# Patient Record
Sex: Female | Born: 1990 | Race: White | Hispanic: No | Marital: Single | Smoking: Never smoker
Health system: Southern US, Community
[De-identification: ages and names within clinical notes are randomized; demographics above are authoritative.]

## PROBLEM LIST (undated history)

## (undated) DIAGNOSIS — F909 Attention-deficit hyperactivity disorder, unspecified type: Secondary | ICD-10-CM

## (undated) DIAGNOSIS — J353 Hypertrophy of tonsils with hypertrophy of adenoids: Secondary | ICD-10-CM

---
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
## 2011-01-03 ENCOUNTER — Telehealth: Payer: Self-pay

## 2011-01-03 NOTE — Telephone Encounter (Signed)
Phone call from mother today, requesting Rx for Concerta 36 mg daily.

## 2011-01-04 ENCOUNTER — Other Ambulatory Visit: Payer: Self-pay

## 2011-01-04 MED ORDER — METHYLPHENIDATE HCL ER (OSM) 36 MG PO TBCR: 36.0000 mg | EXTENDED_RELEASE_TABLET | ORAL | Status: DC

## 2011-01-04 NOTE — Telephone Encounter (Signed)
Written Rx Concerta 36mg  daily.  Need to see last time pt was here- like to see q 6 months for this. May be time for PE.

## 2011-01-18 ENCOUNTER — Encounter: Payer: Self-pay | Admitting: Internal Medicine

## 2011-01-18 ENCOUNTER — Ambulatory Visit (INDEPENDENT_AMBULATORY_CARE_PROVIDER_SITE_OTHER): Payer: BC Managed Care – PPO | Admitting: Internal Medicine

## 2011-01-18 VITALS — BP 108/80 | HR 84 | Temp 98.7°F | Ht 62.0 in | Wt 162.0 lb

## 2011-01-18 DIAGNOSIS — J329 Chronic sinusitis, unspecified: Secondary | ICD-10-CM

## 2011-01-18 DIAGNOSIS — J029 Acute pharyngitis, unspecified: Secondary | ICD-10-CM

## 2011-01-18 DIAGNOSIS — F988 Other specified behavioral and emotional disorders with onset usually occurring in childhood and adolescence: Secondary | ICD-10-CM

## 2011-01-18 NOTE — Progress Notes (Signed)
  Subjective:    Patient ID: Danielle Medina, female    DOB: [birth year], 21 y.o.   MRN: [id number]  HPI 21 year old white female in today with respiratory infection symptoms. Has had cough and congestion. Parents have been ill with respiratory infection symptoms. Getting ready to start back to [employer] next week. No shaking chills or myalgias. Tolerating Concerta well for attention deficit disorder symptoms. Recently picked up a new prescription. Now on generic Concerta.    Review of Systems     Objective:   Physical Exam HEENT exam: Pharynx slightly injected; rapid strep screen negative. TMs are clear; neck supple without significant adenopathy; chest clear. Sounds nasally congested.        Assessment & Plan:  Sinusitis  Attention deficit disorder  Plan: Zithromax Z-Pak 2 tablets by mouth day one followed by 1 tablet by mouth days 2 through 5. May prescribe generic Concerta from now on since it's now available

## 2011-01-18 NOTE — Patient Instructions (Addendum)
Take generic Concerta as corrected for attention deficit disorder. Zithromax Z-PAK take 2 tablets by mouth day one followed by 1 tablet by mouth days 2 through 5.

## 2011-02-11 ENCOUNTER — Telehealth: Payer: Self-pay | Admitting: Internal Medicine

## 2011-02-11 MED ORDER — METHYLPHENIDATE HCL ER (OSM) 36 MG PO TBCR: 36.0000 mg | EXTENDED_RELEASE_TABLET | Freq: Two times a day (BID) | ORAL | Status: DC

## 2011-02-11 NOTE — Telephone Encounter (Signed)
Rx written for Concerta 36mg  1 po bid #60/0 rf

## 2011-03-04 ENCOUNTER — Encounter: Payer: Self-pay | Admitting: Internal Medicine

## 2011-03-27 ENCOUNTER — Telehealth: Payer: Self-pay | Admitting: Internal Medicine

## 2011-03-27 MED ORDER — METHYLPHENIDATE HCL ER (OSM) 36 MG PO TBCR: 36.0000 mg | EXTENDED_RELEASE_TABLET | Freq: Two times a day (BID) | ORAL | Status: DC

## 2011-03-27 NOTE — Telephone Encounter (Signed)
rx for Concerta 36 mg bid printed today.

## 2011-04-15 ENCOUNTER — Ambulatory Visit (INDEPENDENT_AMBULATORY_CARE_PROVIDER_SITE_OTHER): Payer: BC Managed Care – PPO | Admitting: Internal Medicine

## 2011-04-15 ENCOUNTER — Encounter: Payer: Self-pay | Admitting: Internal Medicine

## 2011-04-15 VITALS — BP 106/70 | HR 76 | Temp 98.4°F | Ht 63.0 in | Wt 157.0 lb

## 2011-04-15 DIAGNOSIS — F988 Other specified behavioral and emotional disorders with onset usually occurring in childhood and adolescence: Secondary | ICD-10-CM

## 2011-04-15 DIAGNOSIS — Z0000 Encounter for general adult medical examination without abnormal findings: Secondary | ICD-10-CM

## 2011-04-15 DIAGNOSIS — E785 Hyperlipidemia, unspecified: Secondary | ICD-10-CM

## 2011-04-15 DIAGNOSIS — J069 Acute upper respiratory infection, unspecified: Secondary | ICD-10-CM

## 2011-04-15 LAB — LIPID PANEL
Cholesterol: 211 mg/dL — ABNORMAL HIGH (ref 0–200)
HDL: 77 mg/dL (ref >39)
LDL Cholesterol: 101 mg/dL — ABNORMAL HIGH (ref 0–99)
Total CHOL/HDL Ratio: 2.7 Ratio
Triglycerides: 164 mg/dL — ABNORMAL HIGH (ref <150)
VLDL: 33 mg/dL (ref 0–40)

## 2011-04-16 LAB — CBC WITH DIFFERENTIAL/PLATELET
Basophils Absolute: 0 10*3/uL (ref 0.0–0.1)
Basophils Relative: 0 % (ref 0–1)
Eosinophils Absolute: 0.1 10*3/uL (ref 0.0–0.7)
MCH: 28.3 pg (ref 26.0–34.0)
MCHC: 31.9 g/dL (ref 30.0–36.0)
Neutro Abs: 3.7 10*3/uL (ref 1.7–7.7)
Neutrophils Relative %: 58 % (ref 43–77)
Platelets: 290 10*3/uL (ref 150–400)
RBC: 4.56 MIL/uL (ref 3.87–5.11)

## 2011-04-19 DIAGNOSIS — E785 Hyperlipidemia, unspecified: Secondary | ICD-10-CM | POA: Insufficient documentation

## 2011-04-19 NOTE — Patient Instructions (Signed)
Take Zithromax Z-PAK as directed for respiratory infection. Continue with Concerta 36 mg daily. Return in 6 months for followup on attention deficit.

## 2011-04-19 NOTE — Progress Notes (Signed)
  Subjective:    Patient ID: Danielle Medina, female    DOB: 1990/12/26, 21 y.o.   MRN: [id number]  HPI 21 year old white female in today for health maintenance exam. She has an acute respiratory infection with nasal congestion, cough and sore throat. She has a history of attention deficit disorder and is on Concerta. She has GYN physician is on oral contraceptives. No other chronic problems.  Family history: Mother with history of hyperlipidemia. Father with hyperlipidemia and hypertension. One brother with GE reflux and attention deficit.  Social history: She is single never married. She is attending [employer]. Thinks she might like to studying nursing. Social alcohol consumption. Nonsmoker.    Review of Systems negative except as above     Objective:   Physical Exam HEENT exam: Pharynx slightly injected without exudate. TMs are clear; sounds nasally congested, neck is supple without adenopathy, chest clear to auscultation. Breasts normal female. Cardiac exam regular rate and rhythm normal S1 and S2. Abdomen no hepatosplenomegaly masses or tenderness. GYN exam is deferred to GYN physician. Extremities without deformity. Neuro no focal deficits.        Assessment & Plan:  URI  Attention deficit disorder  Hyperlipidemia discovered today with nonfasting lab work.  Plan: In 6 months patient should have fasting lipid panel. Needs to try to follow a low-fat diet. Has family history of hyperlipidemia in both mother and father.  Patient to return in 6 months for followup on attention deficit and also to have fasting lipid panel. For respiratory infection, given Zithromax Z-Pak take 2 tablets by mouth day one followed by 1 tablet by mouth days 2 through 5

## 2011-05-14 ENCOUNTER — Other Ambulatory Visit: Payer: Self-pay | Admitting: Obstetrics and Gynecology

## 2011-05-16 ENCOUNTER — Other Ambulatory Visit: Payer: Self-pay | Admitting: Internal Medicine

## 2011-05-16 MED ORDER — METHYLPHENIDATE HCL ER (OSM) 36 MG PO TBCR: 36.0000 mg | EXTENDED_RELEASE_TABLET | Freq: Two times a day (BID) | ORAL | Status: DC

## 2011-05-16 NOTE — Telephone Encounter (Signed)
Refill Concerta 36 mg #30 one by mouth daily with no refill

## 2011-07-08 ENCOUNTER — Other Ambulatory Visit: Payer: Self-pay

## 2011-07-08 MED ORDER — METHYLPHENIDATE HCL ER (OSM) 36 MG PO TBCR: 36.0000 mg | EXTENDED_RELEASE_TABLET | Freq: Two times a day (BID) | ORAL | Status: DC

## 2011-08-12 ENCOUNTER — Telehealth: Payer: Self-pay | Admitting: Internal Medicine

## 2011-08-12 NOTE — Telephone Encounter (Signed)
Please refill for 30 days only

## 2011-08-14 ENCOUNTER — Other Ambulatory Visit: Payer: Self-pay

## 2011-08-14 MED ORDER — METHYLPHENIDATE HCL ER (OSM) 36 MG PO TBCR: 36.0000 mg | EXTENDED_RELEASE_TABLET | Freq: Two times a day (BID) | ORAL | Status: DC

## 2011-09-07 DIAGNOSIS — H109 Unspecified conjunctivitis: Secondary | ICD-10-CM

## 2011-09-09 ENCOUNTER — Telehealth: Payer: Self-pay | Admitting: Internal Medicine

## 2011-09-09 NOTE — Telephone Encounter (Signed)
Called around noon on Auugust 24 with  c/o conjunctivitis. Call in to [facility] Ofloxacin opthalmic drops 2 gtts each eye  4 times daily x 5-7 days

## 2011-09-20 ENCOUNTER — Telehealth: Payer: Self-pay | Admitting: Internal Medicine

## 2011-09-23 NOTE — Telephone Encounter (Signed)
Rx for Concerta 36 mg  #60

## 2011-10-15 ENCOUNTER — Ambulatory Visit: Payer: BC Managed Care – PPO | Admitting: Internal Medicine

## 2011-10-17 ENCOUNTER — Ambulatory Visit (INDEPENDENT_AMBULATORY_CARE_PROVIDER_SITE_OTHER): Payer: BC Managed Care – PPO | Admitting: Internal Medicine

## 2011-10-17 ENCOUNTER — Encounter: Payer: Self-pay | Admitting: Internal Medicine

## 2011-10-17 VITALS — BP 114/84 | Temp 99.1°F | Ht 63.0 in | Wt 163.0 lb

## 2011-10-17 DIAGNOSIS — Z23 Encounter for immunization: Secondary | ICD-10-CM

## 2011-10-17 DIAGNOSIS — F988 Other specified behavioral and emotional disorders with onset usually occurring in childhood and adolescence: Secondary | ICD-10-CM

## 2011-10-17 MED ORDER — METHYLPHENIDATE HCL ER (OSM) 36 MG PO TBCR: 36.0000 mg | EXTENDED_RELEASE_TABLET | Freq: Two times a day (BID) | ORAL | Status: DC

## 2011-10-18 NOTE — Progress Notes (Signed)
  Subjective:    Patient ID: Danielle Medina, female    DOB: [birth year], 21 y.o.   MRN: [id number]  HPI 21 year old white female attending [employer] hoping to major in nursing with history of attention deficit disorder in today for followup on attention deficit medication. Currently on Concerta 36 mg twice daily. Says she has trouble remembering to take her medication. Anatomy has given her some problems and she's dropped a class a couple of times. Next semester she just plans to take that class by itself without any other classes. Explained to her that anatomy at [facility] is  a very important class if she plans to get in nursing school. She will need to make a significant commitment to study hard for that class. No other concerns. She agrees to take influenza immunization (FluMist).    Review of Systems     Objective:   Physical Exam Alert and oriented x3. Affect is appropriate. Chest clear to auscultation. Cardiac exam regular rate and rhythm.       Assessment & Plan:  Attention deficit disorder  Influenza FluMist given  Plan: Return in 6 months. New prescription for Concerta 36 mg (#60) 1 by mouth twice a day written.

## 2011-10-18 NOTE — Patient Instructions (Addendum)
Take Concerta 36 mg by mouth twice daily. Return in 6 months. We will continue to refill Concerta by written prescription. Must keep every 6 month appointments.

## 2011-11-18 ENCOUNTER — Telehealth: Payer: Self-pay | Admitting: Internal Medicine

## 2011-11-18 MED ORDER — METHYLPHENIDATE HCL ER (OSM) 36 MG PO TBCR: 36.0000 mg | EXTENDED_RELEASE_TABLET | Freq: Two times a day (BID) | ORAL | Status: DC

## 2011-11-18 NOTE — Telephone Encounter (Signed)
Rx written for Concerta.

## 2011-11-21 ENCOUNTER — Ambulatory Visit (INDEPENDENT_AMBULATORY_CARE_PROVIDER_SITE_OTHER): Payer: BC Managed Care – PPO | Admitting: Internal Medicine

## 2011-11-21 ENCOUNTER — Encounter: Payer: Self-pay | Admitting: Internal Medicine

## 2011-11-21 VITALS — BP 110/72 | Temp 97.8°F | Wt 162.0 lb

## 2011-11-21 DIAGNOSIS — J351 Hypertrophy of tonsils: Secondary | ICD-10-CM

## 2011-11-21 DIAGNOSIS — J029 Acute pharyngitis, unspecified: Secondary | ICD-10-CM

## 2011-11-21 DIAGNOSIS — J040 Acute laryngitis: Secondary | ICD-10-CM

## 2011-11-21 LAB — POCT RAPID STREP A (OFFICE): Rapid Strep A Screen: NEGATIVE

## 2011-11-21 NOTE — Patient Instructions (Addendum)
Take Zithromax Z-PAK 2 tabs day one followed by 1 tab days 2 through 5. Take Tessalon Perles 2 tablets as needed for cough 3 times daily. Stay out of work today.

## 2011-11-21 NOTE — Progress Notes (Signed)
  Subjective:    Patient ID: Danielle Medina, female    DOB: 08-Mar-1990, 21 y.o.   MRN: [id number]  HPI 21 year old white female who works at [employer] and also attends [employer] in today with respiratory infection. Has had nausea and some abdominal pain . No diarrhea. No fever or shaking chills. Has sore throat congestion and laryngitis. History of attention deficit disorder for which she takes Concerta.    Review of Systems     Objective:   Physical Exam HEENT exam: Red pharynx without exudate. Rapid strep screen negative. TMs dull bilaterally but not red. Neck supple without significant adenopathy. Chest clear. Sounds hoarse and squeaky when she speaks.       Assessment & Plan:  Pharyngitis  Laryngitis  Plan: Zithromax Z-Pak take 2 tablets day one followed by 1 tablet days 2 through 5. Tessalon Perles (#60) 2 by mouth 3 times a day when necessary cough

## 2011-12-23 ENCOUNTER — Other Ambulatory Visit: Payer: Self-pay

## 2011-12-23 MED ORDER — METHYLPHENIDATE HCL ER (OSM) 36 MG PO TBCR: 36.0000 mg | EXTENDED_RELEASE_TABLET | Freq: Two times a day (BID) | ORAL | Status: DC

## 2012-01-15 HISTORY — PX: WISDOM TOOTH EXTRACTION: SHX21

## 2012-01-30 ENCOUNTER — Ambulatory Visit (INDEPENDENT_AMBULATORY_CARE_PROVIDER_SITE_OTHER): Payer: BC Managed Care – PPO | Admitting: Internal Medicine

## 2012-01-30 ENCOUNTER — Encounter: Payer: Self-pay | Admitting: Internal Medicine

## 2012-01-30 VITALS — BP 120/86 | Temp 99.1°F | Wt 171.0 lb

## 2012-01-30 DIAGNOSIS — J029 Acute pharyngitis, unspecified: Secondary | ICD-10-CM

## 2012-01-30 DIAGNOSIS — H6690 Otitis media, unspecified, unspecified ear: Secondary | ICD-10-CM

## 2012-01-30 DIAGNOSIS — F988 Other specified behavioral and emotional disorders with onset usually occurring in childhood and adolescence: Secondary | ICD-10-CM

## 2012-01-30 DIAGNOSIS — H6693 Otitis media, unspecified, bilateral: Secondary | ICD-10-CM

## 2012-01-30 MED ORDER — METHYLPHENIDATE HCL ER (OSM) 36 MG PO TBCR: 36.0000 mg | EXTENDED_RELEASE_TABLET | Freq: Two times a day (BID) | ORAL | Status: DC

## 2012-01-31 NOTE — Patient Instructions (Addendum)
Continue Concerta as previously prescribed. Take Zithromax Z-PAK 2 tablets day one followed by 1 tablet days 2 through 5. Take Tessalon Perles as needed for cough.

## 2012-01-31 NOTE — Progress Notes (Signed)
  Subjective:    Patient ID: Danielle Medina, female    DOB: 06-24-1990, 21 y.o.   MRN: [id number]  HPI 22 year old white female with sore throat and respiratory congestion. Has discolored sputum production and nasal drainage. No documented fever or shaking chills. Is in school at [employer]. Also needs refill on Concerta for attention deficit issues. Feels a Concerta is working well for her. No problems with the medication. Recently had 4 wisdom teeth extracted on Monday, January 13. Was ill with respiratory infection at the time she had her teeth extracted.    Review of Systems     Objective:   Physical Exam HEENT exam: Cannot see pharynx well because she cannot open her mouth very well status post wisdom teeth extraction. Sounds nasally congested. TMs are full bilaterally. Neck is supple without significant adenopathy. Chest clear. Affect is normal.        Assessment & Plan:  Pharyngitis  Otitis media-bilateral  Attention deficit disorder-treated with Concerta with good results  Plan: Refill Concerta for 30 days- written prescription given to patient. Zithromax Z-PAK take 2 tablets day one followed by 1 tablet days 2 through 5. Tessalon Perles 100 mg ( #60 ) 2 by mouth 3 times a day when necessary for cough

## 2012-03-02 ENCOUNTER — Telehealth: Payer: Self-pay | Admitting: Internal Medicine

## 2012-03-02 NOTE — Telephone Encounter (Signed)
Written Rx for Concerta 36 mg bid #60

## 2012-04-14 ENCOUNTER — Telehealth: Payer: Self-pay | Admitting: Internal Medicine

## 2012-04-14 MED ORDER — METHYLPHENIDATE HCL ER (OSM) 36 MG PO TBCR: 36.0000 mg | EXTENDED_RELEASE_TABLET | Freq: Two times a day (BID) | ORAL | Status: DC

## 2012-04-27 ENCOUNTER — Telehealth: Payer: Self-pay | Admitting: Internal Medicine

## 2012-04-27 NOTE — Telephone Encounter (Signed)
Spoke with patient and given CPE appt for 5/22 @ 1100 for CPE/PAP.  Patient aware.

## 2012-04-27 NOTE — Telephone Encounter (Signed)
This would be a PE . Do you have an opening?

## 2012-06-01 ENCOUNTER — Telehealth: Payer: Self-pay | Admitting: Internal Medicine

## 2012-06-01 MED ORDER — METHYLPHENIDATE HCL ER (OSM) 36 MG PO TBCR: 36.0000 mg | EXTENDED_RELEASE_TABLET | Freq: Two times a day (BID) | ORAL | Status: DC

## 2012-06-02 NOTE — Telephone Encounter (Signed)
rx printed and signed

## 2012-06-04 ENCOUNTER — Encounter: Payer: Self-pay | Admitting: Internal Medicine

## 2012-07-09 ENCOUNTER — Other Ambulatory Visit: Payer: Self-pay | Admitting: Obstetrics and Gynecology

## 2012-07-29 ENCOUNTER — Other Ambulatory Visit: Payer: Self-pay

## 2012-07-29 MED ORDER — METHYLPHENIDATE HCL ER (OSM) 36 MG PO TBCR: 36.0000 mg | EXTENDED_RELEASE_TABLET | Freq: Two times a day (BID) | ORAL | Status: DC

## 2012-08-03 ENCOUNTER — Ambulatory Visit (INDEPENDENT_AMBULATORY_CARE_PROVIDER_SITE_OTHER): Payer: BC Managed Care – PPO | Admitting: Internal Medicine

## 2012-08-03 ENCOUNTER — Encounter: Payer: Self-pay | Admitting: Internal Medicine

## 2012-08-03 VITALS — BP 108/66 | HR 72 | Temp 99.2°F | Ht 62.0 in | Wt 168.0 lb

## 2012-08-03 DIAGNOSIS — E785 Hyperlipidemia, unspecified: Secondary | ICD-10-CM

## 2012-08-03 DIAGNOSIS — F988 Other specified behavioral and emotional disorders with onset usually occurring in childhood and adolescence: Secondary | ICD-10-CM

## 2012-08-03 DIAGNOSIS — Z0000 Encounter for general adult medical examination without abnormal findings: Secondary | ICD-10-CM

## 2012-08-03 LAB — CBC WITH DIFFERENTIAL/PLATELET
Basophils Relative: 0 % (ref 0–1)
Eosinophils Absolute: 0.1 10*3/uL (ref 0.0–0.7)
Eosinophils Relative: 1 % (ref 0–5)
Hemoglobin: 12 g/dL (ref 12.0–15.0)
MCH: 29.6 pg (ref 26.0–34.0)
MCHC: 33.8 g/dL (ref 30.0–36.0)
MCV: 87.7 fL (ref 78.0–100.0)
Monocytes Absolute: 0.5 10*3/uL (ref 0.1–1.0)
Monocytes Relative: 6 % (ref 3–12)
Neutrophils Relative %: 71 % (ref 43–77)

## 2012-08-03 LAB — CHOLESTEROL, TOTAL: Cholesterol: 177 mg/dL (ref 0–200)

## 2012-08-03 LAB — POCT URINALYSIS DIPSTICK
Bilirubin, UA: NEGATIVE
Blood, UA: NEGATIVE
Nitrite, UA: NEGATIVE
pH, UA: 6

## 2012-09-04 ENCOUNTER — Telehealth: Payer: Self-pay | Admitting: Internal Medicine

## 2012-09-04 NOTE — Telephone Encounter (Signed)
Please fill for 30 days

## 2012-09-07 ENCOUNTER — Other Ambulatory Visit: Payer: Self-pay

## 2012-09-07 MED ORDER — METHYLPHENIDATE HCL ER (OSM) 36 MG PO TBCR: 36.0000 mg | EXTENDED_RELEASE_TABLET | Freq: Two times a day (BID) | ORAL | Status: DC

## 2012-10-15 NOTE — Patient Instructions (Addendum)
Continue Concerta twice daily as previously prescribed. Return in 6 months. Watch diet and exercise.

## 2012-10-15 NOTE — Progress Notes (Signed)
  Subjective:    Patient ID: Danielle Medina, female    DOB: 22-Sep-1990, 22 y.o.   MRN: [id number]  HPI 22 year old White female with history of attention deficit disorder in today for health maintenance and evaluation of attention deficit medication. She takes Concerta. She has a GYN physician, Dr. Vincente Poli. She is on oral contraceptives. No other chronic problems. Occasionally has respiratory infections.  Social history: Single, never married. Attending [employer]. Social alcohol consumption. Nonsmoker.  Family history: Mother with history of hyperlipidemia and GE reflux. Father with hyperlipidemia and hypertension. One brother with GE reflux, attention deficit, and issues with anxiety depression.    Review of Systems  Constitutional: Negative.   HENT: Negative.   Eyes: Negative.   Respiratory: Negative.   Cardiovascular: Negative.   Gastrointestinal: Negative.   Endocrine: Negative.   Genitourinary: Negative.   Allergic/Immunologic: Negative.   Neurological: Negative.   Hematological: Negative.   Psychiatric/Behavioral:       Long-standing history of attention deficit and has been on Concerta for many years even before I assumed her care       Objective:   Physical Exam  Vitals reviewed. Constitutional: She appears well-developed and well-nourished. No distress.  HENT:  Head: Normocephalic and atraumatic.  Right Ear: External ear normal.  Left Ear: External ear normal.  Mouth/Throat: Oropharynx is clear and moist. No oropharyngeal exudate.  Eyes: Conjunctivae and EOM are normal. Pupils are equal, round, and reactive to light. Right eye exhibits no discharge. Left eye exhibits no discharge. No scleral icterus.  Neck: Neck supple. No JVD present. No thyromegaly present.  Cardiovascular: Normal rate, regular rhythm and normal heart sounds.   No murmur heard. Pulmonary/Chest: Effort normal and breath sounds normal. She has no wheezes.  Breasts normal  female  Abdominal: Soft. Bowel sounds are normal. She exhibits no distension and no mass. There is no tenderness. There is no rebound.  Genitourinary:  Deferred to GYN  Musculoskeletal: Normal range of motion. She exhibits no edema.  Neurological: She is alert. She has normal reflexes.  No focal deficits on brief neurological exam  Skin: Skin is warm and dry. No rash noted. She is not diaphoretic.  Psychiatric: She has a normal mood and affect. Her behavior is normal. Judgment and thought content normal.          Assessment & Plan:  Normal health maintenance exam  Attention deficit disorder-continue Concerta 36 mg twice daily. Return in 6 months. Has been on this dose of Concerta for a number of years and that seems to work well for her. Unfortunately I don't think she's always consistent with taking it.  Total cholesterol is 177 and previously was 211 last year. Continue diet and exercise. She is at risk for hyperlipidemia with family history in both parents

## 2012-10-22 ENCOUNTER — Encounter: Payer: Self-pay | Admitting: Internal Medicine

## 2012-10-22 ENCOUNTER — Ambulatory Visit (INDEPENDENT_AMBULATORY_CARE_PROVIDER_SITE_OTHER): Payer: BC Managed Care – PPO | Admitting: Internal Medicine

## 2012-10-22 VITALS — BP 100/64 | HR 68 | Temp 99.1°F | Resp 16 | Wt 159.0 lb

## 2012-10-22 DIAGNOSIS — J019 Acute sinusitis, unspecified: Secondary | ICD-10-CM

## 2012-10-22 DIAGNOSIS — F988 Other specified behavioral and emotional disorders with onset usually occurring in childhood and adolescence: Secondary | ICD-10-CM

## 2012-10-22 MED ORDER — METHYLPHENIDATE HCL ER (OSM) 36 MG PO TBCR: 36.0000 mg | EXTENDED_RELEASE_TABLET | Freq: Two times a day (BID) | ORAL | Status: DC

## 2012-10-22 MED ORDER — AZITHROMYCIN 250 MG PO TABS: ORAL_TABLET | ORAL | Status: DC

## 2012-10-22 MED ORDER — BENZONATATE 100 MG PO CAPS: 200.0000 mg | ORAL_CAPSULE | Freq: Three times a day (TID) | ORAL | Status: DC | PRN

## 2012-10-22 NOTE — Progress Notes (Signed)
  Subjective:    Patient ID: Danielle Medina, female    DOB: 09-07-1990, 22 y.o.   MRN: [id number]  HPI Onset of URI symptoms a couple of days ago. Denies fever chills or sore throat. No ear congestion. Is nasally congested. Slight cough with no sputum production. Thinks she was exposed by a friend. 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 Also says she needs refill on Concerta for attention deficit disorder.    Review of Systems     Objective:   Physical Exam Skin is warm and dry. Nodes none. Pharynx is clear. TMs are clear. She sounds nasally congested. Chest is clear to auscultation without rales or wheezing.        Assessment & Plan:  Sinusitis  Attention deficit disorder  Plan: Was given 2 prescriptions for Concerta-the first one is to be refilled after October 15 and the second one is to be refilled after November 15. Treated with Zithromax Z-PAK today with one refill. Tessalon Perles 200 mg #60 one by mouth 3 times daily as needed for cough.

## 2012-10-22 NOTE — Patient Instructions (Addendum)
2 new rxs for Concerta printed. Take Z-pak as directed. Take Tessalon perles as directed. Flu vaccine given today

## 2012-11-03 ENCOUNTER — Telehealth: Payer: Self-pay | Admitting: Internal Medicine

## 2012-11-03 NOTE — Telephone Encounter (Signed)
Return tomorrow  

## 2012-11-04 ENCOUNTER — Encounter: Payer: Self-pay | Admitting: Internal Medicine

## 2012-11-04 NOTE — Telephone Encounter (Signed)
Patient given appt for 10/23 @ 2:30.  Patient notified and confirmed.

## 2012-11-05 ENCOUNTER — Encounter: Payer: Self-pay | Admitting: Internal Medicine

## 2012-11-05 ENCOUNTER — Ambulatory Visit (INDEPENDENT_AMBULATORY_CARE_PROVIDER_SITE_OTHER): Payer: BC Managed Care – PPO | Admitting: Internal Medicine

## 2012-11-05 VITALS — BP 118/60 | HR 80 | Temp 98.7°F | Wt 159.0 lb

## 2012-11-05 DIAGNOSIS — Z118 Encounter for screening for other infectious and parasitic diseases: Secondary | ICD-10-CM

## 2012-11-05 DIAGNOSIS — Z130 Encounter for screening for diseases of the blood and blood-forming organs and certain disorders involving the immune mechanism: Secondary | ICD-10-CM

## 2012-11-05 DIAGNOSIS — R1900 Intra-abdominal and pelvic swelling, mass and lump, unspecified site: Secondary | ICD-10-CM

## 2012-11-05 DIAGNOSIS — Z3200 Encounter for pregnancy test, result unknown: Secondary | ICD-10-CM

## 2012-11-05 DIAGNOSIS — J069 Acute upper respiratory infection, unspecified: Secondary | ICD-10-CM

## 2012-11-05 DIAGNOSIS — R1084 Generalized abdominal pain: Secondary | ICD-10-CM

## 2012-11-05 LAB — COMPREHENSIVE METABOLIC PANEL
ALT: 13 U/L (ref 0–35)
Albumin: 4.2 g/dL (ref 3.5–5.2)
Alkaline Phosphatase: 61 U/L (ref 39–117)
BUN: 11 mg/dL (ref 6–23)
Calcium: 9.5 mg/dL (ref 8.4–10.5)
Chloride: 106 mEq/L (ref 96–112)
Glucose, Bld: 65 mg/dL — ABNORMAL LOW (ref 70–99)
Sodium: 135 mEq/L (ref 135–145)
Total Bilirubin: 0.5 mg/dL (ref 0.3–1.2)
Total Protein: 7.3 g/dL (ref 6.0–8.3)

## 2012-11-05 LAB — POCT URINALYSIS DIPSTICK
Blood, UA: NEGATIVE
Glucose, UA: NEGATIVE
Protein, UA: NEGATIVE
Spec Grav, UA: 1.02
Urobilinogen, UA: NEGATIVE

## 2012-11-05 LAB — CBC WITH DIFFERENTIAL/PLATELET
Basophils Absolute: 0 10*3/uL (ref 0.0–0.1)
Basophils Relative: 0 % (ref 0–1)
Eosinophils Absolute: 0 10*3/uL (ref 0.0–0.7)
Hemoglobin: 11.7 g/dL — ABNORMAL LOW (ref 12.0–15.0)
Lymphocytes Relative: 19 % (ref 12–46)
MCH: 29.5 pg (ref 26.0–34.0)
MCHC: 33.8 g/dL (ref 30.0–36.0)
Monocytes Relative: 4 % (ref 3–12)
Neutrophils Relative %: 77 % (ref 43–77)
Platelets: 255 10*3/uL (ref 150–400)
RBC: 3.96 MIL/uL (ref 3.87–5.11)
RDW: 13 % (ref 11.5–15.5)

## 2012-11-05 NOTE — Patient Instructions (Signed)
Await results of lab work and transvaginal ultrasound tomorrow.

## 2012-11-05 NOTE — Progress Notes (Signed)
  Subjective:    Patient ID: Danielle Medina, female    DOB: December 16, 1990, 22 y.o.   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 No fever or shaking chills. Cough is productive of slight white sputum production. However main complaint today is? Mass lower abdomen. She says back in August she and her partner were having vigorous intercourse which was quite painful. After that she noted an enlarging lower abdomen. Says she's had decreased appetite and has been losing weight. She took a pregnancy test in August that was negative. Last known normal menstrual period was June 2. Says she was slightly nauseated some over the summer but that resolved. Denies being constipated. Says she has several bowel movements daily. She is not sure of why she has lost weight except for decreased appetite. At one point says she was trying to diet. Apparently did have one episode of spotting in August. She was off birth control in June. Says she started back a couple of months ago. Dr. Eustaquio Boyden is her GYN physician. Apparently she's had pelvic exam with Dr. Eustaquio Boyden this summer. She is on Concerta for attention deficit disorder.    Review of Systems     Objective:   Physical Exam Palpable mass almost up to umbilicus that is slightly tender to touch. Pelvic exam. Cervix is slightly dark but not blue. White vaginal discharge. GC and Chlamydia probes were taken. Urinalysis is normal.        Assessment & Plan:  Mass lower abdomen? Pregnancy-to have pelvic ultrasound tomorrow.  Serum hCG, CBC, C. met drawn. GC and Chlamydia probes have been taken and are pending.  Protracted upper respiratory infection. Await results of pregnancy test before considering further treatment.

## 2012-11-06 ENCOUNTER — Ambulatory Visit
Admission: RE | Admit: 2012-11-06 | Discharge: 2012-11-06 | Disposition: A | Discharge status: Home / Self Care | Payer: BC Managed Care – PPO | Source: Ambulatory Visit | Attending: Internal Medicine | Admitting: Internal Medicine

## 2012-11-06 ENCOUNTER — Telehealth: Payer: Self-pay | Admitting: Internal Medicine

## 2012-11-06 ENCOUNTER — Ambulatory Visit: Admission: RE | Admit: 2012-11-06 | Payer: BC Managed Care – PPO | Source: Ambulatory Visit

## 2012-11-06 DIAGNOSIS — R1900 Intra-abdominal and pelvic swelling, mass and lump, unspecified site: Secondary | ICD-10-CM

## 2012-11-06 LAB — GC/CHLAMYDIA PROBE AMP
CT Probe RNA: NEGATIVE
GC Probe RNA: NEGATIVE

## 2012-11-06 LAB — HCG, QUANTITATIVE, PREGNANCY: hCG, Beta Chain, Quant, S: 3691.3 m[IU]/mL

## 2012-11-06 NOTE — Telephone Encounter (Signed)
Patient is on Concerta. She is also on oral contraceptives. Serum hCG is positive. She is to have an ultrasound of the uterus later today. Says that she last had sexual intercourse before going to [place] around August 26. HCG values would indicate she is 4-[redacted] weeks pregnant. Have sent results to her GYN physician Dr. Eustaquio Boyden. Will call her with ultrasound results later today. Says she is interested in having an abortion.

## 2012-11-09 ENCOUNTER — Telehealth: Payer: Self-pay | Admitting: Internal Medicine

## 2012-11-09 NOTE — Telephone Encounter (Signed)
Limited OB ultrasound apprx 21 weeks Intruterine pregnancy. Pt infprmed and has appt with Dr. Eustaquio Boyden later this wek.

## 2012-11-16 LAB — OB RESULTS CONSOLE HEPATITIS B SURFACE ANTIGEN: Hepatitis B Surface Ag: NEGATIVE

## 2012-11-16 LAB — OB RESULTS CONSOLE RUBELLA ANTIBODY, IGM: Rubella: IMMUNE

## 2012-11-16 LAB — OB RESULTS CONSOLE HIV ANTIBODY (ROUTINE TESTING)
HIV: NONREACTIVE
HIV: NONREACTIVE

## 2013-03-16 ENCOUNTER — Inpatient Hospital Stay (HOSPITAL_COMMUNITY): Payer: BC Managed Care – PPO | Admitting: Certified Registered"

## 2013-03-16 ENCOUNTER — Encounter (HOSPITAL_COMMUNITY)
Admission: RE | Disposition: A | Discharge status: Home / Self Care | Payer: Self-pay | Source: Ambulatory Visit | Attending: Obstetrics and Gynecology

## 2013-03-16 ENCOUNTER — Encounter (HOSPITAL_COMMUNITY): Payer: BC Managed Care – PPO | Admitting: Certified Registered"

## 2013-03-16 ENCOUNTER — Encounter (HOSPITAL_COMMUNITY): Payer: Self-pay | Admitting: *Deleted

## 2013-03-16 ENCOUNTER — Inpatient Hospital Stay (HOSPITAL_COMMUNITY)
Admission: RE | Admit: 2013-03-16 | Discharge: 2013-03-19 | DRG: 766 | Disposition: A | Discharge status: Home / Self Care | Payer: BC Managed Care – PPO | Source: Ambulatory Visit | Attending: Obstetrics and Gynecology | Admitting: Obstetrics and Gynecology

## 2013-03-16 DIAGNOSIS — O321XX0 Maternal care for breech presentation, not applicable or unspecified: Principal | ICD-10-CM | POA: Diagnosis present

## 2013-03-16 DIAGNOSIS — Z98891 History of uterine scar from previous surgery: Secondary | ICD-10-CM

## 2013-03-16 LAB — CBC
HCT: 34.3 % — ABNORMAL LOW (ref 36.0–46.0)
Hemoglobin: 11.8 g/dL — ABNORMAL LOW (ref 12.0–15.0)
MCH: 28.7 pg (ref 26.0–34.0)
MCHC: 34.4 g/dL (ref 30.0–36.0)
MCV: 83.5 fL (ref 78.0–100.0)
PLATELETS: 199 10*3/uL (ref 150–400)
RBC: 4.11 MIL/uL (ref 3.87–5.11)
RDW: 13 % (ref 11.5–15.5)
WBC: 9.1 10*3/uL (ref 4.0–10.5)

## 2013-03-16 LAB — TYPE AND SCREEN
ABO/RH(D): O POS
ANTIBODY SCREEN: NEGATIVE

## 2013-03-16 LAB — RPR: RPR Ser Ql: NONREACTIVE

## 2013-03-16 LAB — ABO/RH: ABO/RH(D): O POS

## 2013-03-16 SURGERY — Surgical Case
Anesthesia: Spinal | Site: Abdomen

## 2013-03-16 MED ORDER — LACTATED RINGERS IV SOLN
INTRAVENOUS | Status: DC
  Administered 2013-03-16 (×2): via INTRAVENOUS

## 2013-03-16 MED ORDER — PRENATAL MULTIVITAMIN CH
1.0000 | ORAL_TABLET | Freq: Every day | ORAL | Status: DC
  Administered 2013-03-17 – 2013-03-19 (×3): 1 via ORAL
  Filled 2013-03-16 (×3): qty 1

## 2013-03-16 MED ORDER — NALOXONE HCL 0.4 MG/ML IJ SOLN: 0.4000 mg | INTRAMUSCULAR | Status: DC | PRN

## 2013-03-16 MED ORDER — SODIUM CHLORIDE 0.9 % IJ SOLN: 3.0000 mL | INTRAMUSCULAR | Status: DC | PRN

## 2013-03-16 MED ORDER — FENTANYL CITRATE 0.05 MG/ML IJ SOLN
25.0000 ug | INTRAMUSCULAR | Status: DC | PRN
  Administered 2013-03-16: 25 ug via INTRAVENOUS

## 2013-03-16 MED ORDER — SCOPOLAMINE 1 MG/3DAYS TD PT72: 1.0000 | MEDICATED_PATCH | Freq: Once | TRANSDERMAL | Status: DC

## 2013-03-16 MED ORDER — SENNOSIDES-DOCUSATE SODIUM 8.6-50 MG PO TABS
2.0000 | ORAL_TABLET | ORAL | Status: DC
  Administered 2013-03-16 – 2013-03-18 (×3): 2 via ORAL
  Filled 2013-03-16 (×4): qty 2

## 2013-03-16 MED ORDER — LANOLIN HYDROUS EX OINT: 1.0000 "application " | TOPICAL_OINTMENT | CUTANEOUS | Status: DC | PRN

## 2013-03-16 MED ORDER — OXYTOCIN 10 UNIT/ML IJ SOLN
INTRAMUSCULAR | Status: AC
  Filled 2013-03-16: qty 4

## 2013-03-16 MED ORDER — KETOROLAC TROMETHAMINE 60 MG/2ML IM SOLN
INTRAMUSCULAR | Status: AC
  Administered 2013-03-16: 60 mg via INTRAMUSCULAR
  Filled 2013-03-16: qty 2

## 2013-03-16 MED ORDER — CEFAZOLIN SODIUM-DEXTROSE 2-3 GM-% IV SOLR
INTRAVENOUS | Status: AC
  Filled 2013-03-16: qty 50

## 2013-03-16 MED ORDER — DIPHENHYDRAMINE HCL 25 MG PO CAPS: 25.0000 mg | ORAL_CAPSULE | Freq: Four times a day (QID) | ORAL | Status: DC | PRN

## 2013-03-16 MED ORDER — OXYCODONE-ACETAMINOPHEN 5-325 MG PO TABS
1.0000 | ORAL_TABLET | ORAL | Status: DC | PRN
  Administered 2013-03-17 – 2013-03-19 (×8): 2 via ORAL
  Filled 2013-03-16 (×8): qty 2

## 2013-03-16 MED ORDER — ZOLPIDEM TARTRATE 5 MG PO TABS: 5.0000 mg | ORAL_TABLET | Freq: Every evening | ORAL | Status: DC | PRN

## 2013-03-16 MED ORDER — OXYTOCIN 10 UNIT/ML IJ SOLN
40.0000 [IU] | INTRAVENOUS | Status: DC | PRN
  Administered 2013-03-16: 40 [IU] via INTRAVENOUS

## 2013-03-16 MED ORDER — MENTHOL 3 MG MT LOZG
1.0000 | LOZENGE | OROMUCOSAL | Status: DC | PRN
  Administered 2013-03-17: 3 mg via ORAL
  Filled 2013-03-16: qty 9

## 2013-03-16 MED ORDER — KETOROLAC TROMETHAMINE 30 MG/ML IJ SOLN: 30.0000 mg | Freq: Four times a day (QID) | INTRAMUSCULAR | Status: AC | PRN

## 2013-03-16 MED ORDER — IBUPROFEN 600 MG PO TABS
600.0000 mg | ORAL_TABLET | Freq: Four times a day (QID) | ORAL | Status: DC
  Administered 2013-03-16 – 2013-03-19 (×10): 600 mg via ORAL
  Filled 2013-03-16 (×11): qty 1

## 2013-03-16 MED ORDER — LACTATED RINGERS IV SOLN
INTRAVENOUS | Status: DC
  Administered 2013-03-16: 22:00:00 via INTRAVENOUS

## 2013-03-16 MED ORDER — SIMETHICONE 80 MG PO CHEW: 80.0000 mg | CHEWABLE_TABLET | ORAL | Status: DC | PRN

## 2013-03-16 MED ORDER — FENTANYL CITRATE 0.05 MG/ML IJ SOLN
INTRAMUSCULAR | Status: AC
  Filled 2013-03-16: qty 2

## 2013-03-16 MED ORDER — WITCH HAZEL-GLYCERIN EX PADS: 1.0000 "application " | MEDICATED_PAD | CUTANEOUS | Status: DC | PRN

## 2013-03-16 MED ORDER — MEPERIDINE HCL 25 MG/ML IJ SOLN: 6.2500 mg | INTRAMUSCULAR | Status: DC | PRN

## 2013-03-16 MED ORDER — ONDANSETRON HCL 4 MG/2ML IJ SOLN
4.0000 mg | INTRAMUSCULAR | Status: DC | PRN
Start: 2013-03-16 — End: 2013-03-19

## 2013-03-16 MED ORDER — MORPHINE SULFATE 0.5 MG/ML IJ SOLN
INTRAMUSCULAR | Status: AC
  Filled 2013-03-16: qty 10

## 2013-03-16 MED ORDER — BUPIVACAINE IN DEXTROSE 0.75-8.25 % IT SOLN
INTRATHECAL | Status: DC | PRN
  Administered 2013-03-16: 1.3 mL via INTRATHECAL

## 2013-03-16 MED ORDER — NALBUPHINE HCL 10 MG/ML IJ SOLN: 5.0000 mg | INTRAMUSCULAR | Status: DC | PRN

## 2013-03-16 MED ORDER — METOCLOPRAMIDE HCL 5 MG/ML IJ SOLN: 10.0000 mg | Freq: Three times a day (TID) | INTRAMUSCULAR | Status: DC | PRN

## 2013-03-16 MED ORDER — MORPHINE SULFATE (PF) 0.5 MG/ML IJ SOLN
INTRAMUSCULAR | Status: DC | PRN
  Administered 2013-03-16: .1 mg via INTRATHECAL

## 2013-03-16 MED ORDER — SCOPOLAMINE 1 MG/3DAYS TD PT72
1.0000 | MEDICATED_PATCH | Freq: Once | TRANSDERMAL | Status: AC
  Administered 2013-03-16: 1.5 mg via TRANSDERMAL

## 2013-03-16 MED ORDER — DIPHENHYDRAMINE HCL 25 MG PO CAPS: 25.0000 mg | ORAL_CAPSULE | ORAL | Status: DC | PRN

## 2013-03-16 MED ORDER — OXYTOCIN 40 UNITS IN LACTATED RINGERS INFUSION - SIMPLE MED: 62.5000 mL/h | INTRAVENOUS | Status: AC

## 2013-03-16 MED ORDER — NALOXONE HCL 1 MG/ML IJ SOLN: 1.0000 ug/kg/h | INTRAVENOUS | Status: DC | PRN

## 2013-03-16 MED ORDER — ONDANSETRON HCL 4 MG PO TABS: 4.0000 mg | ORAL_TABLET | ORAL | Status: DC | PRN

## 2013-03-16 MED ORDER — SCOPOLAMINE 1 MG/3DAYS TD PT72
MEDICATED_PATCH | TRANSDERMAL | Status: AC
  Administered 2013-03-16: 1.5 mg via TRANSDERMAL
  Filled 2013-03-16: qty 1

## 2013-03-16 MED ORDER — CEFAZOLIN SODIUM-DEXTROSE 2-3 GM-% IV SOLR
2.0000 g | INTRAVENOUS | Status: AC
  Administered 2013-03-16: 2 g via INTRAVENOUS

## 2013-03-16 MED ORDER — ONDANSETRON HCL 4 MG/2ML IJ SOLN
INTRAMUSCULAR | Status: DC | PRN
  Administered 2013-03-16: 4 mg via INTRAVENOUS

## 2013-03-16 MED ORDER — LACTATED RINGERS IV SOLN
INTRAVENOUS | Status: DC | PRN
  Administered 2013-03-16: 15:00:00 via INTRAVENOUS

## 2013-03-16 MED ORDER — KETOROLAC TROMETHAMINE 60 MG/2ML IM SOLN
60.0000 mg | Freq: Once | INTRAMUSCULAR | Status: AC | PRN
  Administered 2013-03-16: 60 mg via INTRAMUSCULAR

## 2013-03-16 MED ORDER — TETANUS-DIPHTH-ACELL PERTUSSIS 5-2.5-18.5 LF-MCG/0.5 IM SUSP: 0.5000 mL | Freq: Once | INTRAMUSCULAR | Status: DC

## 2013-03-16 MED ORDER — FENTANYL CITRATE 0.05 MG/ML IJ SOLN
INTRAMUSCULAR | Status: DC | PRN
Start: 2013-03-16 — End: 2013-03-16
  Administered 2013-03-16: 15 ug via INTRATHECAL

## 2013-03-16 MED ORDER — DIPHENHYDRAMINE HCL 50 MG/ML IJ SOLN: 12.5000 mg | INTRAMUSCULAR | Status: DC | PRN

## 2013-03-16 MED ORDER — 0.9 % SODIUM CHLORIDE (POUR BTL) OPTIME
TOPICAL | Status: DC | PRN
  Administered 2013-03-16: 1000 mL

## 2013-03-16 MED ORDER — DIBUCAINE 1 % RE OINT: 1.0000 "application " | TOPICAL_OINTMENT | RECTAL | Status: DC | PRN

## 2013-03-16 MED ORDER — METOCLOPRAMIDE HCL 5 MG/ML IJ SOLN: 10.0000 mg | Freq: Once | INTRAMUSCULAR | Status: DC | PRN

## 2013-03-16 MED ORDER — PHENYLEPHRINE 8 MG IN D5W 100 ML (0.08MG/ML) PREMIX OPTIME
INJECTION | INTRAVENOUS | Status: DC | PRN
  Administered 2013-03-16: 60 ug/min via INTRAVENOUS

## 2013-03-16 MED ORDER — DIPHENHYDRAMINE HCL 50 MG/ML IJ SOLN: 25.0000 mg | INTRAMUSCULAR | Status: DC | PRN

## 2013-03-16 MED ORDER — SIMETHICONE 80 MG PO CHEW
80.0000 mg | CHEWABLE_TABLET | ORAL | Status: DC
  Administered 2013-03-16 – 2013-03-18 (×3): 80 mg via ORAL
  Filled 2013-03-16 (×3): qty 1

## 2013-03-16 MED ORDER — ONDANSETRON HCL 4 MG/2ML IJ SOLN
INTRAMUSCULAR | Status: AC
  Filled 2013-03-16: qty 2

## 2013-03-16 MED ORDER — SIMETHICONE 80 MG PO CHEW
80.0000 mg | CHEWABLE_TABLET | Freq: Three times a day (TID) | ORAL | Status: DC
  Administered 2013-03-17 – 2013-03-19 (×5): 80 mg via ORAL
  Filled 2013-03-16 (×6): qty 1

## 2013-03-16 MED ORDER — PHENYLEPHRINE 8 MG IN D5W 100 ML (0.08MG/ML) PREMIX OPTIME
INJECTION | INTRAVENOUS | Status: AC
  Filled 2013-03-16: qty 100

## 2013-03-16 MED ORDER — ONDANSETRON HCL 4 MG/2ML IJ SOLN: 4.0000 mg | Freq: Three times a day (TID) | INTRAMUSCULAR | Status: DC | PRN

## 2013-03-16 MED ORDER — LACTATED RINGERS IV SOLN: Freq: Once | INTRAVENOUS | Status: DC

## 2013-03-16 MED ORDER — LACTATED RINGERS IV SOLN
INTRAVENOUS | Status: DC
  Administered 2013-03-16: 14:00:00 via INTRAVENOUS

## 2013-03-16 SURGICAL SUPPLY — 36 items
ADH SKN CLS APL DERMABOND .7 (GAUZE/BANDAGES/DRESSINGS) ×1
BARRIER ADHS 3X4 INTERCEED (GAUZE/BANDAGES/DRESSINGS) IMPLANT
BRR ADH 4X3 ABS CNTRL BYND (GAUZE/BANDAGES/DRESSINGS)
CLAMP CORD UMBIL (MISCELLANEOUS) IMPLANT
CLOTH BEACON ORANGE TIMEOUT ST (SAFETY) ×3 IMPLANT
CONTAINER PREFILL 10% NBF 15ML (MISCELLANEOUS) IMPLANT
DERMABOND ADVANCED (GAUZE/BANDAGES/DRESSINGS) ×2
DERMABOND ADVANCED .7 DNX12 (GAUZE/BANDAGES/DRESSINGS) IMPLANT
DRAPE LG THREE QUARTER DISP (DRAPES) ×2 IMPLANT
DRSG OPSITE POSTOP 4X10 (GAUZE/BANDAGES/DRESSINGS) ×3 IMPLANT
DURAPREP 26ML APPLICATOR (WOUND CARE) ×3 IMPLANT
ELECT REM PT RETURN 9FT ADLT (ELECTROSURGICAL) ×3
ELECTRODE REM PT RTRN 9FT ADLT (ELECTROSURGICAL) ×1 IMPLANT
EXTRACTOR VACUUM M CUP 4 TUBE (SUCTIONS) IMPLANT
EXTRACTOR VACUUM M CUP 4' TUBE (SUCTIONS)
GLOVE BIO SURGEON STRL SZ 6.5 (GLOVE) ×2 IMPLANT
GLOVE BIO SURGEONS STRL SZ 6.5 (GLOVE) ×1
GOWN STRL REUS W/TWL LRG LVL3 (GOWN DISPOSABLE) ×6 IMPLANT
KIT ABG SYR 3ML LUER SLIP (SYRINGE) IMPLANT
NDL HYPO 25X5/8 SAFETYGLIDE (NEEDLE) ×1 IMPLANT
NEEDLE HYPO 22GX1.5 SAFETY (NEEDLE) IMPLANT
NEEDLE HYPO 25X5/8 SAFETYGLIDE (NEEDLE) ×3 IMPLANT
NS IRRIG 1000ML POUR BTL (IV SOLUTION) ×3 IMPLANT
PACK C SECTION WH (CUSTOM PROCEDURE TRAY) ×3 IMPLANT
PAD OB MATERNITY 4.3X12.25 (PERSONAL CARE ITEMS) ×3 IMPLANT
STAPLER VISISTAT 35W (STAPLE) IMPLANT
SUT CHROMIC 0 CTX 36 (SUTURE) ×6 IMPLANT
SUT PLAIN 0 NONE (SUTURE) IMPLANT
SUT PLAIN 2 0 XLH (SUTURE) IMPLANT
SUT VIC AB 0 CT1 27 (SUTURE) ×12
SUT VIC AB 0 CT1 27XBRD ANBCTR (SUTURE) ×3 IMPLANT
SUT VIC AB 4-0 KS 27 (SUTURE) ×2 IMPLANT
SYR CONTROL 10ML LL (SYRINGE) IMPLANT
TOWEL OR 17X24 6PK STRL BLUE (TOWEL DISPOSABLE) ×5 IMPLANT
TRAY FOLEY CATH 14FR (SET/KITS/TRAYS/PACK) ×3 IMPLANT
WATER STERILE IRR 1000ML POUR (IV SOLUTION) ×1 IMPLANT

## 2013-03-16 NOTE — Preoperative (Signed)
Beta Blockers   Reason not to administer Beta Blockers:Not Applicable 

## 2013-03-16 NOTE — Anesthesia Procedure Notes (Signed)
Spinal  Patient location during procedure: OR Start time: 03/16/2013 2:32 PM Staffing Performed by: anesthesiologist  Preanesthetic Checklist Completed: patient identified, site marked, surgical consent, pre-op evaluation, timeout performed, IV checked, risks and benefits discussed and monitors and equipment checked Spinal Block Patient position: sitting Prep: site prepped and draped and DuraPrep Patient monitoring: blood pressure, continuous pulse ox and heart rate Approach: midline Location: L3-4 Injection technique: single-shot Needle Needle type: Pencan  Needle gauge: 24 G Needle length: 10 cm Assessment Sensory level: T4 Additional Notes Clear free flow CSF on first attempt.  No paresthesia.  Patient tolerated procedure well with no apparent complications.  Jasmine DecemberA. Cassidy, MD

## 2013-03-16 NOTE — H&P (Signed)
Danielle Medina is a 23 y.o. female presenting for Primary LTCS For Breech Presentation. History OB History   Grav Para Term Preterm Abortions TAB SAB Ect Mult Living                 Past Medical History  Diagnosis Date  . ADD (attention deficit disorder)   . Mononucleosis    No past surgical history on file. Family History: family history includes Diabetes in her maternal grandmother and paternal grandfather; Heart disease in her maternal grandfather. Social History:  reports that she has never smoked. She does not have any smokeless tobacco history on file. Her alcohol and drug histories are not on file.   Prenatal Transfer Tool  Maternal Diabetes: No Genetic Screening: Normal Maternal Ultrasounds/Referrals: Normal Fetal Ultrasounds or other Referrals:  None Maternal Substance Abuse:  No Significant Maternal Medications:  None Significant Maternal Lab Results:  None Other Comments:  None  Review of Systems  All other systems reviewed and are negative.      There were no vitals taken for this visit. Maternal Exam:  Abdomen: Fetal presentation: breech     Physical Exam  Nursing note and vitals reviewed. Constitutional: She appears well-developed.  Eyes: Pupils are equal, round, and reactive to light.  Neck: Normal range of motion.  Cardiovascular: Normal rate.   GI: Soft.    Prenatal labs: ABO, Rh:   Antibody:   Rubella:   RPR:    HBsAg:    HIV:    GBS:     Assessment/Plan: IUP at 39 weeks Breech Primary LTCS Risks reviewed  Consent signed  [name] L 03/16/2013, 8:53 AM

## 2013-03-16 NOTE — Progress Notes (Signed)
History and physical on the chart  No complications Ultrasound - breech presentation Consent signed

## 2013-03-16 NOTE — Anesthesia Preprocedure Evaluation (Signed)
Anesthesia Evaluation  Patient identified by MRN, date of birth, ID band Patient awake    Reviewed: Allergy & Precautions, H&P , NPO status , Patient's Chart, lab work & pertinent test results, reviewed documented beta blocker date and time   History of Anesthesia Complications Negative for: history of anesthetic complications  Airway Mallampati: I TM Distance: >3 FB Neck ROM: full    Dental  (+) Teeth Intact   Pulmonary Recent URI  (to complete final dose of zpack for sinus infection today, no fevers),  breath sounds clear to auscultation        Cardiovascular negative cardio ROS  Rhythm:regular Rate:Normal     Neuro/Psych PSYCHIATRIC DISORDERS (ADD) negative neurological ROS     GI/Hepatic Neg liver ROS, GERD-  Medicated,  Endo/Other  BMI 35.6  Renal/GU negative Renal ROS  negative genitourinary   Musculoskeletal   Abdominal   Peds  Hematology negative hematology ROS (+)   Anesthesia Other Findings   Reproductive/Obstetrics (+) Pregnancy (breech)                           Anesthesia Physical Anesthesia Plan  ASA: II  Anesthesia Plan: Spinal   Post-op Pain Management:    Induction:   Airway Management Planned:   Additional Equipment:   Intra-op Plan:   Post-operative Plan:   Informed Consent: I have reviewed the patients History and Physical, chart, labs and discussed the procedure including the risks, benefits and alternatives for the proposed anesthesia with the patient or authorized representative who has indicated his/her understanding and acceptance.     Plan Discussed with: Surgeon and CRNA  Anesthesia Plan Comments:         Anesthesia Quick Evaluation

## 2013-03-16 NOTE — Anesthesia Postprocedure Evaluation (Signed)
  Anesthesia Post-op Note  Anesthesia Post Note  Patient: Danielle Medina L Erekson  Procedure(s) Performed: Procedure(s) (LRB): CESAREAN SECTION (N/A)  Anesthesia type: Spinal  Patient location: PACU  Post pain: Pain level controlled  Post assessment: Post-op Vital signs reviewed  Last Vitals:  Filed Vitals:   03/16/13 1615  BP: 120/76  Pulse: 73  Temp:   Resp: 24    Post vital signs: Reviewed  Level of consciousness: awake  Complications: No apparent anesthesia complications

## 2013-03-16 NOTE — Consult Note (Signed)
Neonatology Note:   Attendance at C-section:    I was asked by Dr. Vincente PoliGrewal to attend this primary C/S at term due to breech presentation. The mother is a G1P0 O pos, GBS pos with ADD and an otherwise uncomplicated pregnancy. ROM at delivery, fluid clear. Infant with decreased tone and slow to cry at delivery. Needed bulb suctioning and vigorous stimulation, to which he responded quickly. Ap 8/9. Lungs clear to ausc in DR, low resting HR noted. To CN to care of Pediatrician.   Doretha Souhristie C. Easten Maceachern, MD

## 2013-03-16 NOTE — Transfer of Care (Signed)
Immediate Anesthesia Transfer of Care Note  Patient: Danielle Medina  Procedure(s) Performed: Procedure(s) with comments: CESAREAN SECTION (N/A) - Primary edc 3/9  Patient Location: PACU  Anesthesia Type:Spinal  Level of Consciousness: awake, alert  and oriented  Airway & Oxygen Therapy: Patient Spontanous Breathing  Post-op Assessment: Report given to PACU RN and Post -op Vital signs reviewed and stable  Post vital signs: Reviewed and stable  Complications: No apparent anesthesia complications

## 2013-03-16 NOTE — Lactation Note (Signed)
This note was copied from the chart of Danielle Shea Evansaylor Henard. Lactation Consultation Note  Patient Name: Danielle Medina UJWJX'BToday's Date: 03/16/2013 Reason for consult: Initial assessment of this first-time mom and her newborn, just 5 hours of age and having had 2 successful breastfeedings.  Initial LATCH score=7 per RN assessment.  LC demonstrated hand expression and discussed and encouraged cue feedings and frequent STS. LC encouraged review of Baby and Me pp 9, 14 and 20-25 for STS and BF information. LC provided [employer] brochure and reviewed [facility] and list of community and web site resources.    Maternal Data Formula Feeding for Exclusion: No Infant to breast within first hour of birth: Yes (initial LATCH score=7) Has patient been taught Hand Expression?: Yes (LC demonstration but no colostrum expressed) Does the patient have breastfeeding experience prior to this delivery?: No  Feeding Feeding Type: Breast Fed  LATCH Score/Interventions Latch: Grasps breast easily, tongue down, lips flanged, rhythmical sucking.  Audible Swallowing: None Intervention(s): Skin to skin;Hand expression  Type of Nipple: Everted at rest and after stimulation  Comfort (Breast/Nipple): Soft / non-tender     Hold (Positioning): Assistance needed to correctly position infant at breast and maintain latch.  LATCH Score: 7 (per RN assessment  Lactation Tools Discussed/Used  STS, hand expression, cue feedings, breast massage  Consult Status Consult Status: Follow-up Date: 03/17/13 Follow-up type: In-patient    Danielle Medina, [facility]ina [facility] 03/16/2013, 8:13 PM

## 2013-03-16 NOTE — Brief Op Note (Signed)
03/16/2013  3:23 PM  PATIENT:  Danielle Medina  23 y.o. female  PRE-OPERATIVE DIAGNOSIS:  IUP at 6539 w 1 days, Breech Presentation  POST-OPERATIVE DIAGNOSIS:  Same  PROCEDURE:  Procedure(s) with comments: CESAREAN SECTION (N/A) - Primary edc 3/9  SURGEON:  Surgeon(s) and Role:    * Jeani HawkingMichelle L Cing [place], MD - Primary  PHYSICIAN ASSISTANT:   ASSISTANTS: none   ANESTHESIA:   spinal  EBL:  Total I/O In: 2100 [I.V.:2100] Out: 550 [Urine:300; Blood:250]  BLOOD ADMINISTERED:none  DRAINS: Urinary Catheter (Foley)   LOCAL MEDICATIONS USED:  NONE  SPECIMEN:  No Specimen  DISPOSITION OF SPECIMEN:  N/A  COUNTS:  YES  TOURNIQUET:  * No tourniquets in log *  DICTATION: .Other Dictation: Dictation Number [id number]  PLAN OF CARE: Admit to inpatient   PATIENT DISPOSITION:  PACU - hemodynamically stable.   Delay start of Pharmacological VTE agent (>24hrs) due to surgical blood loss or risk of bleeding: not applicable

## 2013-03-17 ENCOUNTER — Encounter (HOSPITAL_COMMUNITY): Payer: Self-pay | Admitting: Obstetrics and Gynecology

## 2013-03-17 LAB — CBC
HCT: 29.4 % — ABNORMAL LOW (ref 36.0–46.0)
Hemoglobin: 10 g/dL — ABNORMAL LOW (ref 12.0–15.0)
MCH: 28.3 pg (ref 26.0–34.0)
MCHC: 34 g/dL (ref 30.0–36.0)
MCV: 83.3 fL (ref 78.0–100.0)
PLATELETS: 154 10*3/uL (ref 150–400)
RBC: 3.53 MIL/uL — ABNORMAL LOW (ref 3.87–5.11)
RDW: 13.1 % (ref 11.5–15.5)
WBC: 10.2 10*3/uL (ref 4.0–10.5)

## 2013-03-17 NOTE — Anesthesia Postprocedure Evaluation (Signed)
Anesthesia Post Note  Patient: Danielle Medina  Procedure(s) Performed: Procedure(s) (LRB): CESAREAN SECTION (N/A)  Anesthesia type: Spinal  Patient location: Mother/Baby  Post pain: Pain level controlled  Post assessment: Post-op Vital signs reviewed  Last Vitals:  Filed Vitals:   03/17/13 0532  BP: 104/70  Pulse: 92  Temp: 37.1 C  Resp: 20    Post vital signs: Reviewed  Level of consciousness: awake  Complications: No apparent anesthesia complications

## 2013-03-17 NOTE — Addendum Note (Signed)
Addendum created 03/17/13 0911 by Graciela HusbandsWynn O Ellice Boultinghouse, CRNA   Modules edited: Notes Section   Notes Section:  File: [id number]

## 2013-03-17 NOTE — Lactation Note (Signed)
This note was copied from the chart of Danielle Shea Evansaylor Berrey. Lactation Consultation Note  Patient Name: Danielle Medina HQION'GToday's Date: 03/17/2013   Infant was circumcised today with poor feeding to follow.  In past 24 hrs infant has breastfed x3 (10 minutes) + 1 (5 minute); voids- 4; stools -6 in past 24 hrs.  Infant was asleep skin-to-skin with mom.  LC offered assistance with latching and mom accepted.  Mom tried to latch infant using cradle hold.  LC taught mom cross-cradle with sandwiching technique and asymetrical latching.  Infant latched with minimal stimulation needed to keep in a sucking pattern, swallows heard with compressions. LS-7.  Infant was still feeding when LC left room.  Encouraged mom to awaken infant for latching if infant has been sleeping for several hours.  Encouraged to call for assistance as needed.    Maternal Data    Feeding Feeding Type: Breast Fed Length of feed: 4 min  LATCH Score/Interventions Latch: Repeated attempts needed to sustain latch, nipple held in mouth throughout feeding, stimulation needed to elicit sucking reflex. Intervention(s): Assist with latch  Audible Swallowing: A few with stimulation Intervention(s): Skin to skin Intervention(s): Skin to skin  Type of Nipple: Flat Intervention(s): Hand pump  Comfort (Breast/Nipple): Soft / non-tender     Hold (Positioning): Assistance needed to correctly position infant at breast and maintain latch. Intervention(s): Breastfeeding basics reviewed  LATCH Score: 6  Lactation Tools Discussed/Used     Consult Status Consult Status: Follow-up Date: 03/18/13 Follow-up type: In-patient    Danielle Medina, Danielle Medina 03/17/2013, 11:36 PM

## 2013-03-17 NOTE — Op Note (Signed)
NAME:  Danielle Medina, Danielle Medina               ACCOUNT NO.:  [id number]  MEDICAL RECORD NO.:  [id number]  LOCATION:  9129                          FACILITY:  [facility]  PHYSICIAN:  Karandeep Resende L. Jeffifer Rabold, M.D.DATE OF BIRTH:  1990/06/03  DATE OF PROCEDURE:  03/16/2013 DATE OF DISCHARGE:                              OPERATIVE REPORT   PREOPERATIVE DIAGNOSIS:  Intrauterine pregnancy at 39 weeks and 1 day, in breech presentation.  POSTOPERATIVE DIAGNOSIS:  Intrauterine pregnancy at 39 weeks and 1 day, in breech presentation.  PROCEDURE:  Primary low transverse cesarean section.  SURGEON:  Ryoma Nofziger L. Vincente PoliGrewal, M.D.  ANESTHESIA:  Spinal.  ESTIMATED BLOOD LOSS:  500 mL.  COMPLICATIONS:  None.  DRAINS:  Foley catheter.  PROCEDURE:  The patient was taken to the operating room.  Her spinal was placed.  She was then prepped and draped.  A Foley catheter was inserted.  Low transverse incision was made, carried down to the fascia. Fascia scored in the midline, extended laterally.  Rectus muscles were separated in the midline.  The peritoneum was entered bluntly. Peritoneal incision was then extended.  The bladder flap was created sharply and then digitally.  Bladder blade was then readjusted, a low transverse incision was made in the uterus.  The baby was then, a frank breech presentation, delivered via breech extraction, was a female infant, Apgars 8 at one minute, 9 at five minutes.  The cord was clamped and cut.  Baby was handed to the awaiting neonatal team.  The placenta was manually removed, noted to be normal, intact with a three-vessel cord. The uterus was exteriorized and cleared of all clots and debris.  The uterine incision was closed in 1 layer using 0 chromic in a running, locked stitch.  Uterus was returned to the abdomen.  Irrigation was performed.  Hemostasis was excellent.  The peritoneum was closed using 0 Vicryl.  The fascia closed using 0 Vicryl in a running stitch.  After irrigation, the  skin was closed with 3-0 Vicryl on a Keith needle.  All sponge, lap, and instrument counts were correct x2.  The patient went to recovery room in stable condition.    Faren Florence L. Vincente PoliGrewal, M.D.    Florestine AversMLG/MEDQ  D:  03/16/2013  T:  03/17/2013  Job:  [id number]

## 2013-03-17 NOTE — Progress Notes (Signed)
Patient counseled for circ including risk of bleeding, infection, and scarring.  All questions were answered and the patient wishes to proceed.   Ellinore Merced, DO 

## 2013-03-17 NOTE — Progress Notes (Signed)
Subjective: Postpartum Day 1: Cesarean Delivery Patient reports tolerating PO, + flatus and no problems voiding.    Objective: Vital signs in last 24 hours: Temp:  [97.6 F (36.4 C)-98.8 F (37.1 C)] 98.8 F (37.1 C) (03/04 0532) Pulse Rate:  [58-107] 92 (03/04 0532) Resp:  [16-24] 20 (03/04 0532) BP: (104-123)/(62-88) 104/70 mmHg (03/04 0532) SpO2:  [97 %-100 %] 97 % (03/04 0532) Weight:  [194 lb (87.998 kg)] 194 lb (87.998 kg) (03/03 1303)  Physical Exam:  General: alert and cooperative Lochia: appropriate Uterine Fundus: firm Incision: honeycomb dressing CDI DVT Evaluation: No evidence of DVT seen on physical exam. Negative Homan's sign. No cords or calf tenderness. No significant calf/ankle edema.   Recent Labs  03/16/13 1255 03/17/13 0547  HGB 11.8* 10.0*  HCT 34.3* 29.4*    Assessment/Plan: Status post Cesarean section. Doing well postoperatively.  Continue current care.  CURTIS,CAROL G 03/17/2013, 8:21 AM

## 2013-03-18 NOTE — Lactation Note (Signed)
This note was copied from the chart of Danielle Shea Evansaylor Burich. Lactation Consultation Note: Called to assist mom with latch. Assisted mom in football hold and mom reports this position works well. Baby is able to latch- nipples erect some with stimulation. Discussed need for NS- encouraged to continue latching without it. No further questions at present. To call for assist prn  Patient Name: Danielle Medina ZOXWR'UToday's Date: 03/18/2013 Reason for consult: Follow-up assessment   Maternal Data    Feeding Feeding Type: Breast Fed Length of feed: 15 min  LATCH Score/Interventions Latch: Grasps breast easily, tongue down, lips flanged, rhythmical sucking.  Audible Swallowing: A few with stimulation  Type of Nipple: Flat Intervention(s): Hand pump  Comfort (Breast/Nipple): Soft / non-tender     Hold (Positioning): Assistance needed to correctly position infant at breast and maintain latch. Intervention(s): Breastfeeding basics reviewed;Support Pillows;Position options;Skin to skin  LATCH Score: 7  Lactation Tools Discussed/Used     Consult Status Consult Status: Follow-up Date: 03/19/13 Follow-up type: In-patient    Danielle Medina, Hanako Tipping D 03/18/2013, 1:06 PM

## 2013-03-18 NOTE — Progress Notes (Signed)
Subjective: Postpartum Day 2: Cesarean Delivery Patient reports tolerating PO, + flatus and no problems voiding.    Objective: Vital signs in last 24 hours: Temp:  [97.5 F (36.4 C)-98.1 F (36.7 C)] 98.1 F (36.7 C) (03/05 0611) Pulse Rate:  [67-90] 67 (03/05 0611) Resp:  [17-18] 17 (03/05 0611) BP: (105-111)/(67-76) 105/67 mmHg (03/05 0611) SpO2:  [97 %] 97 % (03/05 16100611)  Physical Exam:  General: alert and cooperative Lochia: appropriate Uterine Fundus: firm Incision: healing well DVT Evaluation: No evidence of DVT seen on physical exam. Negative Homan's sign. No cords or calf tenderness. No significant calf/ankle edema.   Recent Labs  03/16/13 1255 03/17/13 0547  HGB 11.8* 10.0*  HCT 34.3* 29.4*    Assessment/Plan: Status post Cesarean section. Doing well postoperatively.  Continue current care.  Kaley Jutras G 03/18/2013, 8:27 AM

## 2013-03-18 NOTE — Lactation Note (Signed)
This note was copied from the chart of Danielle Shea Evansaylor Terrones. Lactation Consultation Note: Follow up visit with mom.She reports that her nipples are flat and maybe she needs a nipple shield. Reports that baby is latching on and she feels tugging,no pain after the first few sucks. Reports that he last fed for 20 minutes. Encouraged to call for assist at next feeding. Discussed need for NS and if baby is able to latch without it there is no need to use it. No questions at present.  Patient Name: Danielle Medina ZOXWR'UToday's Date: 03/18/2013 Reason for consult: Follow-up assessment   Maternal Data    Feeding   LATCH Score/Interventions                      Lactation Tools Discussed/Used     Consult Status Consult Status: Follow-up Date: 03/18/13 Follow-up type: In-patient    Pamelia HoitWeeks, Traevon Meiring D 03/18/2013, 11:21 AM

## 2013-03-19 MED ORDER — IBUPROFEN 600 MG PO TABS: 600.0000 mg | ORAL_TABLET | Freq: Four times a day (QID) | ORAL | Status: DC

## 2013-03-19 MED ORDER — OXYCODONE-ACETAMINOPHEN 5-325 MG PO TABS: 1.0000 | ORAL_TABLET | ORAL | Status: DC | PRN

## 2013-03-19 NOTE — Discharge Summary (Signed)
Obstetric Discharge Summary Reason for Admission: cesarean section Prenatal Procedures: ultrasound Intrapartum Procedures: cesarean: low cervical, transverse Postpartum Procedures: none Complications-Operative and Postpartum: none Hemoglobin  Date Value Ref Range Status  03/17/2013 10.0* 12.0 - 15.0 g/dL Final     HCT  Date Value Ref Range Status  03/17/2013 29.4* 36.0 - 46.0 % Final    Physical Exam:  General: alert and cooperative Lochia: appropriate Uterine Fundus: firm Incision: healing well DVT Evaluation: No evidence of DVT seen on physical exam. Negative Homan's sign. No cords or calf tenderness.  Discharge Diagnoses: Term Pregnancy-delivered  Discharge Information: Date: 03/19/2013 Activity: pelvic rest Diet: routine Medications: PNV, Ibuprofen and Percocet Condition: stable Instructions: refer to practice specific booklet Discharge to: home   Newborn Data: Live born female  Birth Weight: 7 lb 7.2 oz (3380 g) APGAR: 8, 9  Home with mother.  CURTIS,CAROL G 03/19/2013, 8:41 AM

## 2013-03-19 NOTE — Lactation Note (Signed)
This note was copied from the chart of Danielle Medina. Lactation Consultation Note  Patient Name: Danielle Medina MWUXL'KToday's Date: 03/19/2013 Reason for consult: Follow-up assessment Baby breastfeeding upon arrival to room. Mother had latched her baby well on the breast independently and denies any discomfort. Mother expressed colostrum prior to latch and feels better after seeing milk. Ready for discharge. Reviewed outpatient lactation services and support group.  Maternal Data    Feeding Feeding Type: Breast Fed Length of feed: 25 min  LATCH Score/Interventions Latch: Grasps breast easily, tongue down, lips flanged, rhythmical sucking.  Audible Swallowing: A few with stimulation Intervention(s): Skin to skin  Type of Nipple: Everted at rest and after stimulation  Comfort (Breast/Nipple): Soft / non-tender     Hold (Positioning): No assistance needed to correctly position infant at breast.  LATCH Score: 9  Lactation Tools Discussed/Used     Consult Status Consult Status: Complete    [name], [name] 03/19/2013, 2:45 PM

## 2013-03-29 ENCOUNTER — Telehealth: Payer: Self-pay | Admitting: Internal Medicine

## 2013-03-30 NOTE — Telephone Encounter (Signed)
Is she breast feeding? If so, cannot take it now.

## 2013-03-30 NOTE — Telephone Encounter (Signed)
Spoke with patient's mother Eunice BlaseDebbie, who says she is nursing her baby. Explained she could not take Concerta while nursing.

## 2013-04-20 ENCOUNTER — Other Ambulatory Visit: Payer: Self-pay | Admitting: Obstetrics and Gynecology

## 2013-08-23 ENCOUNTER — Other Ambulatory Visit: Payer: Self-pay

## 2013-08-23 ENCOUNTER — Telehealth: Payer: Self-pay | Admitting: Internal Medicine

## 2013-08-23 MED ORDER — METHYLPHENIDATE HCL ER (OSM) 36 MG PO TBCR: 36.0000 mg | EXTENDED_RELEASE_TABLET | Freq: Two times a day (BID) | ORAL | Status: DC

## 2013-08-23 NOTE — Telephone Encounter (Signed)
Please refill Concerta for 3 months

## 2013-11-15 ENCOUNTER — Encounter (HOSPITAL_COMMUNITY): Payer: Self-pay | Admitting: Obstetrics and Gynecology

## 2014-06-08 ENCOUNTER — Other Ambulatory Visit: Payer: Self-pay | Admitting: Obstetrics and Gynecology

## 2014-06-09 LAB — CYTOLOGY - PAP

## 2014-06-25 ENCOUNTER — Encounter (HOSPITAL_BASED_OUTPATIENT_CLINIC_OR_DEPARTMENT_OTHER): Payer: Self-pay

## 2014-06-25 ENCOUNTER — Emergency Department (HOSPITAL_BASED_OUTPATIENT_CLINIC_OR_DEPARTMENT_OTHER): Payer: BLUE CROSS/BLUE SHIELD

## 2014-06-25 ENCOUNTER — Emergency Department (HOSPITAL_BASED_OUTPATIENT_CLINIC_OR_DEPARTMENT_OTHER)
Admission: EM | Admit: 2014-06-25 | Discharge: 2014-06-25 | Disposition: A | Discharge status: Home / Self Care | Payer: BLUE CROSS/BLUE SHIELD | Attending: Emergency Medicine | Admitting: Emergency Medicine

## 2014-06-25 DIAGNOSIS — X58XXXA Exposure to other specified factors, initial encounter: Secondary | ICD-10-CM | POA: Diagnosis not present

## 2014-06-25 DIAGNOSIS — Z8659 Personal history of other mental and behavioral disorders: Secondary | ICD-10-CM | POA: Insufficient documentation

## 2014-06-25 DIAGNOSIS — Y998 Other external cause status: Secondary | ICD-10-CM | POA: Diagnosis not present

## 2014-06-25 DIAGNOSIS — Y9289 Other specified places as the place of occurrence of the external cause: Secondary | ICD-10-CM | POA: Insufficient documentation

## 2014-06-25 DIAGNOSIS — S93401A Sprain of unspecified ligament of right ankle, initial encounter: Secondary | ICD-10-CM | POA: Insufficient documentation

## 2014-06-25 DIAGNOSIS — Y9301 Activity, walking, marching and hiking: Secondary | ICD-10-CM | POA: Diagnosis not present

## 2014-06-25 DIAGNOSIS — Z8619 Personal history of other infectious and parasitic diseases: Secondary | ICD-10-CM | POA: Insufficient documentation

## 2014-06-25 DIAGNOSIS — S99911A Unspecified injury of right ankle, initial encounter: Secondary | ICD-10-CM | POA: Diagnosis present

## 2014-06-25 NOTE — ED Notes (Signed)
Just returned from radiology via wc

## 2014-06-25 NOTE — ED Notes (Signed)
Rt foot elevated, ice pack placed

## 2014-06-25 NOTE — ED Notes (Signed)
DC instructions provided to pt and family member, pain control discussed utilizing elevation and ice as needed. Instructions on splint application and removal along with use of crutches and safety while using crutches. Teach Back method utilized, opportunity for questions provided

## 2014-06-25 NOTE — ED Provider Notes (Signed)
CSN: [id number]     Arrival date & time 06/25/14  0730 History   First MD Initiated Contact with Patient 06/25/14 0753     Chief Complaint  Patient presents with  . Ankle Injury     (Consider location/radiation/quality/duration/timing/severity/associated sxs/prior Treatment) HPI Complains of right ankle pain located at lateral aspect of ankle after she forcibly inverted her right ankle while walking down steps last night. Pain is worse with weightbearing improved with keeping weight off of her foot. No treatment prior to coming here no other injury. No other associated symptoms Past Medical History  Diagnosis Date  . ADD (attention deficit disorder)   . Mononucleosis    Past Surgical History  Procedure Laterality Date  . Wisdom tooth extraction Bilateral 2014  . Cesarean section N/A 03/16/2013    Procedure: CESAREAN SECTION;  Surgeon: Jeani Hawking, MD;  Location: [facility];  Service: Obstetrics;  Laterality: N/A;  Primary edc 3/9   Family History  Problem Relation Age of Onset  . Diabetes Maternal Grandmother   . Heart disease Maternal Grandfather   . Diabetes Paternal Grandfather    History  Substance Use Topics  . Smoking status: Never Smoker   . Smokeless tobacco: Not on file  . Alcohol Use: No   OB History    Gravida Para Term Preterm AB TAB SAB Ectopic Multiple Living   1 1 1  0 0 0 0 0 0 1     Review of Systems  Constitutional: Negative.   Musculoskeletal: Positive for arthralgias.       Right ankle pain      Allergies  Review of patient's allergies indicates no known allergies.  Home Medications   Prior to Admission medications   Medication Sig Start Date End Date Taking? Authorizing Provider  levonorgestrel (MIRENA) 20 MCG/24HR IUD 1 each by Intrauterine route once.   Yes Historical Provider, MD   BP 114/74 mmHg  Pulse 63  Temp(Src) 98 F (36.7 C) (Oral)  Resp 16  SpO2 99%  LMP 06/08/2014 Physical Exam  Constitutional: She appears  well-developed and well-nourished. No distress.  HENT:  Head: Normocephalic and atraumatic.  Eyes: EOM are normal.  Neck: Neck supple.  Cardiovascular: Normal rate.   Pulmonary/Chest: Effort normal.  Abdominal: She exhibits no distension.  Musculoskeletal:  Right lower extremity skin intact minimal swelling at lateral ankle. Tender over right lateral malleolus. Nontender over the proximal fibula. Negative Thompson's test. DP pulse 2+. Good capillary refill. All other extremities or contusion abrasion or tenderness neurovascularly intact  Skin: Skin is warm and dry.  Psychiatric: She has a normal mood and affect.  Nursing note and vitals reviewed.   ED Course  Procedures (including critical care time) Labs Review Labs Reviewed - No data to display  Imaging Review No results found.   EKG Interpretation None     [facility] pain medicine. X-ray viewed by me. Velcro ankle splint applied by technician. Comfortable for patient. Patient able to walk on crutches without difficulty. Results for orders placed or performed in visit on 06/08/14  Cytology - PAP  Result Value Ref Range   CYTOLOGY - PAP PAP RESULT    Dg Ankle Complete Right  06/25/2014   CLINICAL DATA:  Fall down steps with right ankle pain, initial encounter  EXAM: RIGHT ANKLE - COMPLETE 3+ VIEW  COMPARISON:  None.  FINDINGS: There is no evidence of fracture, dislocation, or joint effusion. There is no evidence of arthropathy or other focal bone abnormality. Soft tissues are  unremarkable.  IMPRESSION: No acute abnormality noted.   Electronically Signed   By: Alcide Clever M.D.   On: 06/25/2014 08:04     MDM  Plan Tylenol or Advil for pain. Patient instructed to follow-up with her private orthopedic doctor if she has significant pain or difficulty walking 3-5 days Diagnosis right ankle sprain Final diagnoses:  None        Doug Sou, MD 06/25/14 [phone]

## 2014-06-25 NOTE — Discharge Instructions (Signed)
Ankle Sprain  Take Tylenol or Advil for pain. Call your orthopedic doctor to schedule an office visit if you continue to have significant pain or difficulty walking in 3-5 days. An ankle sprain is an injury to the strong, fibrous tissues (ligaments) that hold the bones of your ankle joint together.  CAUSES An ankle sprain is usually caused by a fall or by twisting your ankle. Ankle sprains most commonly occur when you step on the outer edge of your foot, and your ankle turns inward. People who participate in sports are more prone to these types of injuries.  SYMPTOMS   Pain in your ankle. The pain may be present at rest or only when you are trying to stand or walk.  Swelling.  Bruising. Bruising may develop immediately or within 1 to 2 days after your injury.  Difficulty standing or walking, particularly when turning corners or changing directions. DIAGNOSIS  Your caregiver will ask you details about your injury and perform a physical exam of your ankle to determine if you have an ankle sprain. During the physical exam, your caregiver will press on and apply pressure to specific areas of your foot and ankle. Your caregiver will try to move your ankle in certain ways. An X-ray exam may be done to be sure a bone was not broken or a ligament did not separate from one of the bones in your ankle (avulsion fracture).  TREATMENT  Certain types of braces can help stabilize your ankle. Your caregiver can make a recommendation for this. Your caregiver may recommend the use of medicine for pain. If your sprain is severe, your caregiver may refer you to a surgeon who helps to restore function to parts of your skeletal system (orthopedist) or a physical therapist. HOME CARE INSTRUCTIONS   Apply ice to your injury for 1-2 days or as directed by your caregiver. Applying ice helps to reduce inflammation and pain.  Put ice in a plastic bag.  Place a towel between your skin and the bag.  Leave the ice on  for 15-20 minutes at a time, every 2 hours while you are awake.  Only take over-the-counter or prescription medicines for pain, discomfort, or fever as directed by your caregiver.  Elevate your injured ankle above the level of your heart as much as possible for 2-3 days.  If your caregiver recommends crutches, use them as instructed. Gradually put weight on the affected ankle. Continue to use crutches or a cane until you can walk without feeling pain in your ankle.  If you have a plaster splint, wear the splint as directed by your caregiver. Do not rest it on anything harder than a pillow for the first 24 hours. Do not put weight on it. Do not get it wet. You may take it off to take a shower or bath.  You may have been given an elastic bandage to wear around your ankle to provide support. If the elastic bandage is too tight (you have numbness or tingling in your foot or your foot becomes cold and blue), adjust the bandage to make it comfortable.  If you have an air splint, you may blow more air into it or let air out to make it more comfortable. You may take your splint off at night and before taking a shower or bath. Wiggle your toes in the splint several times per day to decrease swelling. SEEK MEDICAL CARE IF:   You have rapidly increasing bruising or swelling.  Your toes feel  extremely cold or you lose feeling in your foot.  Your pain is not relieved with medicine. SEEK IMMEDIATE MEDICAL CARE IF:  Your toes are numb or blue.  You have severe pain that is increasing. MAKE SURE YOU:   Understand these instructions.  Will watch your condition.  Will get help right away if you are not doing well or get worse. Document Released: 12/31/2004 Document Revised: 09/25/2011 Document Reviewed: 01/12/2011 [facility] Patient Information 2015 [place], [place]. This information is not intended to replace advice given to you by your health care provider. Make sure you discuss any questions you have  with your health care provider.

## 2014-06-25 NOTE — ED Notes (Signed)
Pt states fell on steps last PM approx 2130hrs, states had pain immediately.

## 2014-06-25 NOTE — ED Notes (Signed)
Patient here with right lateral ankle pain after missing step last pm and twisting ankle. Pain with ambulation, no obvious deformity

## 2014-06-25 NOTE — ED Notes (Signed)
MD at bedside. 

## 2014-12-15 ENCOUNTER — Ambulatory Visit: Payer: Self-pay | Admitting: Internal Medicine

## 2014-12-16 ENCOUNTER — Ambulatory Visit (INDEPENDENT_AMBULATORY_CARE_PROVIDER_SITE_OTHER): Payer: BLUE CROSS/BLUE SHIELD | Admitting: Internal Medicine

## 2014-12-16 ENCOUNTER — Encounter: Payer: Self-pay | Admitting: Internal Medicine

## 2014-12-16 VITALS — BP 100/78 | HR 84 | Temp 97.4°F | Resp 20 | Ht 62.0 in | Wt 172.0 lb

## 2014-12-16 DIAGNOSIS — H6693 Otitis media, unspecified, bilateral: Secondary | ICD-10-CM

## 2014-12-16 DIAGNOSIS — J0100 Acute maxillary sinusitis, unspecified: Secondary | ICD-10-CM | POA: Diagnosis not present

## 2014-12-16 NOTE — Progress Notes (Signed)
   Subjective:    Patient ID: Glena Norfolkaylor L Fanning, female    DOB: 1990-04-09, 24 y.o.   MRN: [id number]  HPI Patient has had URI symptoms for 2-1/2 weeks. Has had maxillary sinus congestion. Has had discolored nasal drainage and discolored sputum production. No fever or shaking chills. Her son is now 7020 months old. She is not breast-feeding. She says Zithromax does not work for her but amoxicillin was given to her at a [facility] sometime last year and it did work for her.    Review of Systems     Objective:   Physical Exam  She has bilateral otitis media with TMs pink and full bilaterally. Pharynx very slightly injected. Has anterior cervical nodes bilaterally. Neck is supple. Chest clear. Sounds nasally congested      Assessment & Plan:  Acute bilateral otitis media  Acute sinusitis  Plan: Amoxicillin 500 mg 3 times daily for 10 days. Sterapred DS 10 mg 6 day dosepak. Hycodan 1 teaspoon by mouth every 8 hours when necessary cough. Diflucan 150 mg tablet if needed for Candida vaginitis while on antibiotics and steroids. Call if not better in 10 days or sooner if worse.

## 2014-12-16 NOTE — Patient Instructions (Signed)
Take amoxicillin as prescribed for 10 days. Take prednisone dosepak as prescribed. Take Hycodan as needed for cough. Call if not better in 7-10 days or sooner if worse. Diflucan if needed for Candida vaginitis.

## 2015-01-11 ENCOUNTER — Ambulatory Visit (INDEPENDENT_AMBULATORY_CARE_PROVIDER_SITE_OTHER): Payer: BLUE CROSS/BLUE SHIELD | Admitting: Internal Medicine

## 2015-01-11 ENCOUNTER — Encounter: Payer: Self-pay | Admitting: Internal Medicine

## 2015-01-11 VITALS — BP 108/72 | HR 60 | Temp 98.0°F | Resp 20 | Ht 62.0 in | Wt 177.0 lb

## 2015-01-11 DIAGNOSIS — J0101 Acute recurrent maxillary sinusitis: Secondary | ICD-10-CM | POA: Diagnosis not present

## 2015-01-11 MED ORDER — HYDROCODONE-HOMATROPINE 5-1.5 MG/5ML PO SYRP: 5.0000 mL | ORAL_SOLUTION | Freq: Three times a day (TID) | ORAL | Status: DC | PRN

## 2015-01-11 MED ORDER — FLUCONAZOLE 150 MG PO TABS: 150.0000 mg | ORAL_TABLET | Freq: Once | ORAL | Status: DC

## 2015-01-11 MED ORDER — CLARITHROMYCIN 500 MG PO TABS: 500.0000 mg | ORAL_TABLET | Freq: Two times a day (BID) | ORAL | Status: DC

## 2015-01-11 NOTE — Patient Instructions (Signed)
Take Biaxin 500 mg twice daily for 10 days. Take Hycodan sparingly for cough. Diflucan if needed for yeast infection.

## 2015-01-11 NOTE — Progress Notes (Signed)
   Subjective:    Patient ID: Glena Norfolkaylor L Alf, female    DOB: [birth year], 24 y.o.   MRN: [id number]  HPI Patient was seen December 2 with a protracted sinus infection. She was treated with a ten-day course of amoxicillin and treated with of a Sterapred 10 mg 6 day dosepak. Was given Hycodan for cough. She did not finish entire 10 day course. Apparently did not take last 3 days of prescription. Symptoms have returned. She has no fever. Has discolored postnasal drip and some slight cough. No fever or shaking chills. Slight sore throat.    Review of Systems     Objective:   Physical Exam  Skin warm and dry. Nodes none. TMs chronically scarred. Pharynx slightly injected without exudate. Neck is supple. Chest clear to auscultation without rales or wheezing.      Assessment & Plan:  Sinusitis  Plan: Biaxin 500 mg twice daily for 10 days. Hycodan 1 teaspoon by mouth every 8 hours when necessary cough. Diflucan 150 mg tablet with 1 refill should patient develop Candida vaginitis while on antibiotics.

## 2015-07-07 ENCOUNTER — Emergency Department (HOSPITAL_BASED_OUTPATIENT_CLINIC_OR_DEPARTMENT_OTHER)
Admission: EM | Admit: 2015-07-07 | Discharge: 2015-07-07 | Disposition: A | Discharge status: Home / Self Care | Payer: BLUE CROSS/BLUE SHIELD | Attending: Emergency Medicine | Admitting: Emergency Medicine

## 2015-07-07 ENCOUNTER — Encounter (HOSPITAL_BASED_OUTPATIENT_CLINIC_OR_DEPARTMENT_OTHER): Payer: Self-pay | Admitting: Emergency Medicine

## 2015-07-07 DIAGNOSIS — R109 Unspecified abdominal pain: Secondary | ICD-10-CM | POA: Diagnosis present

## 2015-07-07 DIAGNOSIS — R0789 Other chest pain: Secondary | ICD-10-CM

## 2015-07-07 DIAGNOSIS — Z79818 Long term (current) use of other agents affecting estrogen receptors and estrogen levels: Secondary | ICD-10-CM | POA: Insufficient documentation

## 2015-07-07 DIAGNOSIS — Z79899 Other long term (current) drug therapy: Secondary | ICD-10-CM | POA: Diagnosis not present

## 2015-07-07 LAB — URINALYSIS, ROUTINE W REFLEX MICROSCOPIC
BILIRUBIN URINE: NEGATIVE
GLUCOSE, UA: NEGATIVE mg/dL
HGB URINE DIPSTICK: NEGATIVE
KETONES UR: 15 mg/dL — AB
Leukocytes, UA: NEGATIVE
Nitrite: NEGATIVE
PH: 6.5 (ref 5.0–8.0)
Protein, ur: NEGATIVE mg/dL
SPECIFIC GRAVITY, URINE: 1.023 (ref 1.005–1.030)

## 2015-07-07 LAB — PREGNANCY, URINE: Preg Test, Ur: NEGATIVE

## 2015-07-07 MED ORDER — NAPROXEN 250 MG PO TABS
500.0000 mg | ORAL_TABLET | Freq: Once | ORAL | Status: AC
  Administered 2015-07-07: 500 mg via ORAL
  Filled 2015-07-07: qty 2

## 2015-07-07 MED ORDER — NAPROXEN 500 MG PO TABS: ORAL_TABLET | ORAL | Status: DC

## 2015-07-07 MED ORDER — ALBUTEROL SULFATE HFA 108 (90 BASE) MCG/ACT IN AERS
2.0000 | INHALATION_SPRAY | RESPIRATORY_TRACT | Status: DC | PRN
  Administered 2015-07-07: 2 via RESPIRATORY_TRACT
  Filled 2015-07-07: qty 6.7

## 2015-07-07 NOTE — ED Notes (Signed)
Pt c/o intermittent pain to upper chest, mid back and lower abd. Pt reports some nausea, but denies vomiting or diarrhea.

## 2015-07-07 NOTE — ED Provider Notes (Signed)
CSN: [id number]     Arrival date & time 07/07/15  0607 History   First MD Initiated Contact with Patient 07/07/15 0617     Chief Complaint  Patient presents with  . Chest Pain     (Consider location/radiation/quality/duration/timing/severity/associated sxs/prior Treatment) HPI  This is a 25 year old female complains of discomfort in her upper back and the center of her chest. This began last night and has been somewhat intermittent. The symptoms are moderate. She has the sensation that she cannot take a deep breath due to "catch" deep in her chest. She is also having pain and tenderness in her trapezius muscles. She denies acute injury or overexertion. 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  She is also having some intermittent left lower quadrant pain which is sharp and fleeting. She has had some nausea but no vomiting, diarrhea, fever, chills, dysuria, vaginal bleeding or vaginal discharge.   Past Medical History  Diagnosis Date  . ADD (attention deficit disorder)   . Mononucleosis    Past Surgical History  Procedure Laterality Date  . Wisdom tooth extraction Bilateral 2014  . Cesarean section N/A 03/16/2013    Procedure: CESAREAN SECTION;  Surgeon: Jeani HawkingMichelle L Grewal, MD;  Location: [facility];  Service: Obstetrics;  Laterality: N/A;  Primary edc 3/9   Family History  Problem Relation Age of Onset  . Diabetes Maternal Grandmother   . Heart disease Maternal Grandfather   . Diabetes Paternal Grandfather   . Hyperlipidemia Mother   . Hyperlipidemia Father   . Hypertension Father    Social History  Substance Use Topics  . Smoking status: Never Smoker   . Smokeless tobacco: None  . Alcohol Use: No   OB History    Gravida Para Term Preterm AB TAB SAB Ectopic Multiple Living   1 1 1  0 0 0 0 0 0 1     Review of Systems  All other systems reviewed and are negative.   Allergies  Review of patient's allergies indicates no known allergies.  Home Medications    Prior to Admission medications   Medication Sig Start Date End Date Taking? Authorizing Provider  levonorgestrel (MIRENA) 20 MCG/24HR IUD 1 each by Intrauterine route once.   Yes Historical Provider, MD  methylphenidate 36 MG PO CR tablet TAKE 2 TABLETS BY MOUTH DAILY CAN BE FILLED 12/04/14 12/03/14  Yes Historical Provider, MD  clarithromycin (BIAXIN) 500 MG tablet Take 1 tablet (500 mg total) by mouth 2 (two) times daily. 01/11/15   Margaree MackintoshMary J Baxley, MD  fluconazole (DIFLUCAN) 150 MG tablet Take 1 tablet (150 mg total) by mouth once. 01/11/15   Margaree MackintoshMary J Baxley, MD  HYDROcodone-homatropine [facility](HYCODAN) 5-1.5 MG/5ML syrup Take 5 mLs by mouth every 8 (eight) hours as needed for cough. 01/11/15   Margaree MackintoshMary J Baxley, MD   BP 111/77 mmHg  Pulse 106  Temp(Src) 98.9 F (37.2 C) (Oral)  Resp 16  Ht 5\' 2"  (1.575 m)  Wt 168 lb (76.204 kg)  BMI 30.72 kg/m2  SpO2 98%  LMP 06/18/2015 (Approximate)   Physical Exam  General: Well-developed, well-nourished female in no acute distress; appearance consistent with age of record HENT: normocephalic; atraumatic Eyes: pupils equal, round and reactive to light; extraocular muscles intact Neck: supple Heart: regular rate and rhythm Lungs: clear to auscultation bilaterally; shallow breaths Chest: Mild upper chest wall tenderness; bilateral trapezius tenderness; pain on movement of upper back Abdomen: soft; nondistended; nontender; bowel sounds present Extremities: No deformity; full range  of motion; pulses normal Neurologic: Awake, alert and oriented; motor function intact in all extremities and symmetric; no facial droop Skin: Warm and dry Psychiatric: Normal mood and affect    ED Course  Procedures (including critical care time)   MDM   Nursing notes and vitals signs, including pulse oximetry, reviewed.  Summary of this visit's results, reviewed by myself:  Labs:  Results for orders placed or performed during the hospital encounter of 07/07/15 (from  the past 24 hour(s))  Urinalysis, Routine w reflex microscopic (not [facility])     Status: Abnormal   Collection Time: 07/07/15  6:15 AM  Result Value Ref Range   Color, Urine YELLOW YELLOW   APPearance CLEAR CLEAR   Specific Gravity, Urine 1.023 1.005 - 1.030   pH 6.5 5.0 - 8.0   Glucose, UA NEGATIVE NEGATIVE mg/dL   Hgb urine dipstick NEGATIVE NEGATIVE   Bilirubin Urine NEGATIVE NEGATIVE   Ketones, ur 15 (A) NEGATIVE mg/dL   Protein, ur NEGATIVE NEGATIVE mg/dL   Nitrite NEGATIVE NEGATIVE   Leukocytes, UA NEGATIVE NEGATIVE  Pregnancy, urine     Status: None   Collection Time: 07/07/15  6:15 AM  Result Value Ref Range   Preg Test, Ur NEGATIVE NEGATIVE   6:50 AM Air movement improved and taking deeper breaths after albuterol. Suspect pain is musculoskeletal.  Paula LibraJohn Dewie Ahart, MD 07/07/15 [phone]

## 2016-12-14 DIAGNOSIS — J353 Hypertrophy of tonsils with hypertrophy of adenoids: Secondary | ICD-10-CM

## 2016-12-14 HISTORY — DX: Hypertrophy of tonsils with hypertrophy of adenoids: J35.3

## 2017-01-01 ENCOUNTER — Other Ambulatory Visit: Payer: Self-pay | Admitting: Otolaryngology

## 2017-01-02 ENCOUNTER — Other Ambulatory Visit: Payer: Self-pay

## 2017-01-02 ENCOUNTER — Encounter (HOSPITAL_BASED_OUTPATIENT_CLINIC_OR_DEPARTMENT_OTHER): Payer: Self-pay | Admitting: *Deleted

## 2017-01-13 ENCOUNTER — Ambulatory Visit (HOSPITAL_BASED_OUTPATIENT_CLINIC_OR_DEPARTMENT_OTHER): Payer: Managed Care, Other (non HMO) | Admitting: Anesthesiology

## 2017-01-13 ENCOUNTER — Encounter (HOSPITAL_BASED_OUTPATIENT_CLINIC_OR_DEPARTMENT_OTHER): Payer: Self-pay | Admitting: *Deleted

## 2017-01-13 ENCOUNTER — Encounter (HOSPITAL_BASED_OUTPATIENT_CLINIC_OR_DEPARTMENT_OTHER)
Admission: RE | Disposition: A | Discharge status: Home / Self Care | Payer: Self-pay | Source: Ambulatory Visit | Attending: Otolaryngology

## 2017-01-13 ENCOUNTER — Ambulatory Visit (HOSPITAL_BASED_OUTPATIENT_CLINIC_OR_DEPARTMENT_OTHER)
Admission: RE | Admit: 2017-01-13 | Discharge: 2017-01-13 | Disposition: A | Discharge status: Home / Self Care | Payer: Managed Care, Other (non HMO) | Source: Ambulatory Visit | Attending: Otolaryngology | Admitting: Otolaryngology

## 2017-01-13 ENCOUNTER — Other Ambulatory Visit: Payer: Self-pay

## 2017-01-13 DIAGNOSIS — J3503 Chronic tonsillitis and adenoiditis: Secondary | ICD-10-CM | POA: Diagnosis not present

## 2017-01-13 DIAGNOSIS — J353 Hypertrophy of tonsils with hypertrophy of adenoids: Secondary | ICD-10-CM | POA: Diagnosis present

## 2017-01-13 HISTORY — PX: TONSILLECTOMY AND ADENOIDECTOMY: SHX28

## 2017-01-13 HISTORY — DX: Attention-deficit hyperactivity disorder, unspecified type: F90.9

## 2017-01-13 HISTORY — DX: Hypertrophy of tonsils with hypertrophy of adenoids: J35.3

## 2017-01-13 SURGERY — TONSILLECTOMY AND ADENOIDECTOMY
Anesthesia: General | Site: Throat | Laterality: Bilateral

## 2017-01-13 MED ORDER — AMOXICILLIN 400 MG/5ML PO SUSR: 800.0000 mg | Freq: Two times a day (BID) | ORAL | 0 refills | Status: AC

## 2017-01-13 MED ORDER — LIDOCAINE 2% (20 MG/ML) 5 ML SYRINGE
INTRAMUSCULAR | Status: AC
  Filled 2017-01-13: qty 5

## 2017-01-13 MED ORDER — DEXAMETHASONE SODIUM PHOSPHATE 4 MG/ML IJ SOLN
INTRAMUSCULAR | Status: DC | PRN
  Administered 2017-01-13: 10 mg via INTRAVENOUS

## 2017-01-13 MED ORDER — PROPOFOL 10 MG/ML IV BOLUS
INTRAVENOUS | Status: AC
  Filled 2017-01-13: qty 20

## 2017-01-13 MED ORDER — DEXAMETHASONE SODIUM PHOSPHATE 10 MG/ML IJ SOLN
INTRAMUSCULAR | Status: AC
  Filled 2017-01-13: qty 1

## 2017-01-13 MED ORDER — HYDROMORPHONE HCL 1 MG/ML IJ SOLN
0.2500 mg | INTRAMUSCULAR | Status: DC | PRN
  Administered 2017-01-13: 0.25 mg via INTRAVENOUS

## 2017-01-13 MED ORDER — LIDOCAINE 2% (20 MG/ML) 5 ML SYRINGE
INTRAMUSCULAR | Status: DC | PRN
  Administered 2017-01-13: 100 mg via INTRAVENOUS

## 2017-01-13 MED ORDER — SUCCINYLCHOLINE CHLORIDE 20 MG/ML IJ SOLN
INTRAMUSCULAR | Status: DC | PRN
  Administered 2017-01-13: 100 mg via INTRAVENOUS

## 2017-01-13 MED ORDER — PROPOFOL 10 MG/ML IV BOLUS
INTRAVENOUS | Status: DC | PRN
  Administered 2017-01-13: 180 mg via INTRAVENOUS
  Administered 2017-01-13: 20 mg via INTRAVENOUS
  Administered 2017-01-13: 50 mg via INTRAVENOUS

## 2017-01-13 MED ORDER — SODIUM CHLORIDE 0.9 % IR SOLN
Status: DC | PRN
  Administered 2017-01-13: 200 mL

## 2017-01-13 MED ORDER — OXYMETAZOLINE HCL 0.05 % NA SOLN
NASAL | Status: DC | PRN
  Administered 2017-01-13: 1 via TOPICAL

## 2017-01-13 MED ORDER — OXYCODONE HCL 5 MG PO TABS: 5.0000 mg | ORAL_TABLET | Freq: Once | ORAL | Status: AC | PRN

## 2017-01-13 MED ORDER — MEPERIDINE HCL 25 MG/ML IJ SOLN: 6.2500 mg | INTRAMUSCULAR | Status: DC | PRN

## 2017-01-13 MED ORDER — ONDANSETRON HCL 4 MG/2ML IJ SOLN
INTRAMUSCULAR | Status: AC
  Filled 2017-01-13: qty 2

## 2017-01-13 MED ORDER — PROMETHAZINE HCL 25 MG/ML IJ SOLN: 6.2500 mg | INTRAMUSCULAR | Status: DC | PRN

## 2017-01-13 MED ORDER — OXYCODONE HCL 5 MG/5ML PO SOLN: 5.0000 mg | ORAL | 0 refills | Status: AC | PRN

## 2017-01-13 MED ORDER — FENTANYL CITRATE (PF) 100 MCG/2ML IJ SOLN
50.0000 ug | INTRAMUSCULAR | Status: AC | PRN
  Administered 2017-01-13: 25 ug via INTRAVENOUS
  Administered 2017-01-13: 100 ug via INTRAVENOUS
  Administered 2017-01-13 (×2): 25 ug via INTRAVENOUS

## 2017-01-13 MED ORDER — MIDAZOLAM HCL 2 MG/2ML IJ SOLN
1.0000 mg | INTRAMUSCULAR | Status: DC | PRN
  Administered 2017-01-13: 2 mg via INTRAVENOUS

## 2017-01-13 MED ORDER — FENTANYL CITRATE (PF) 100 MCG/2ML IJ SOLN
INTRAMUSCULAR | Status: AC
  Filled 2017-01-13: qty 2

## 2017-01-13 MED ORDER — LACTATED RINGERS IV SOLN
INTRAVENOUS | Status: DC
  Administered 2017-01-13 (×2): via INTRAVENOUS

## 2017-01-13 MED ORDER — OXYCODONE HCL 5 MG/5ML PO SOLN
ORAL | Status: AC
  Filled 2017-01-13: qty 5

## 2017-01-13 MED ORDER — HYDROMORPHONE HCL 1 MG/ML IJ SOLN
INTRAMUSCULAR | Status: AC
Start: 2017-01-13 — End: 2017-01-13
  Filled 2017-01-13: qty 0.5

## 2017-01-13 MED ORDER — ONDANSETRON HCL 4 MG/2ML IJ SOLN
INTRAMUSCULAR | Status: DC | PRN
  Administered 2017-01-13: 4 mg via INTRAVENOUS

## 2017-01-13 MED ORDER — SCOPOLAMINE 1 MG/3DAYS TD PT72: 1.0000 | MEDICATED_PATCH | Freq: Once | TRANSDERMAL | Status: DC | PRN

## 2017-01-13 MED ORDER — OXYCODONE HCL 5 MG/5ML PO SOLN
5.0000 mg | Freq: Once | ORAL | Status: AC | PRN
  Administered 2017-01-13: 5 mg via ORAL

## 2017-01-13 MED ORDER — MIDAZOLAM HCL 2 MG/2ML IJ SOLN
INTRAMUSCULAR | Status: AC
  Filled 2017-01-13: qty 2

## 2017-01-13 MED ORDER — SUCCINYLCHOLINE CHLORIDE 200 MG/10ML IV SOSY
PREFILLED_SYRINGE | INTRAVENOUS | Status: AC
  Filled 2017-01-13: qty 10

## 2017-01-13 SURGICAL SUPPLY — 32 items
BANDAGE COBAN STERILE 2 (GAUZE/BANDAGES/DRESSINGS) IMPLANT
CANISTER SUCT 1200ML W/VALVE (MISCELLANEOUS) ×2 IMPLANT
CATH ROBINSON RED A/P 10FR (CATHETERS) IMPLANT
CATH ROBINSON RED A/P 14FR (CATHETERS) ×1 IMPLANT
COAGULATOR SUCT SWTCH 10FR 6 (ELECTROSURGICAL) IMPLANT
COVER BACK TABLE 60X90IN (DRAPES) ×2 IMPLANT
COVER MAYO STAND STRL (DRAPES) ×2 IMPLANT
ELECT REM PT RETURN 9FT ADLT (ELECTROSURGICAL) ×2
ELECT REM PT RETURN 9FT PED (ELECTROSURGICAL)
ELECTRODE REM PT RETRN 9FT PED (ELECTROSURGICAL) IMPLANT
ELECTRODE REM PT RTRN 9FT ADLT (ELECTROSURGICAL) IMPLANT
GAUZE SPONGE 4X4 12PLY STRL LF (GAUZE/BANDAGES/DRESSINGS) ×2 IMPLANT
GLOVE BIO SURGEON STRL SZ7.5 (GLOVE) ×2 IMPLANT
GLOVE SURG SYN 7.5  E (GLOVE) ×1
GLOVE SURG SYN 7.5 E (GLOVE) ×1 IMPLANT
GLOVE SURG SYN 7.5 PF PI (GLOVE) IMPLANT
GOWN STRL REUS W/ TWL LRG LVL3 (GOWN DISPOSABLE) ×2 IMPLANT
GOWN STRL REUS W/TWL LRG LVL3 (GOWN DISPOSABLE) ×4
IV NS 500ML (IV SOLUTION) ×2
IV NS 500ML BAXH (IV SOLUTION) ×1 IMPLANT
MARKER SKIN DUAL TIP RULER LAB (MISCELLANEOUS) IMPLANT
NS IRRIG 1000ML POUR BTL (IV SOLUTION) ×2 IMPLANT
SHEET MEDIUM DRAPE 40X70 STRL (DRAPES) ×2 IMPLANT
SOLUTION BUTLER CLEAR DIP (MISCELLANEOUS) ×2 IMPLANT
SPONGE TONSIL 1 RF SGL (DISPOSABLE) IMPLANT
SPONGE TONSIL 1.25 RF SGL STRG (GAUZE/BANDAGES/DRESSINGS) ×1 IMPLANT
SYR BULB 3OZ (MISCELLANEOUS) IMPLANT
TOWEL OR 17X24 6PK STRL BLUE (TOWEL DISPOSABLE) ×2 IMPLANT
TUBE CONNECTING 20X1/4 (TUBING) ×2 IMPLANT
TUBE SALEM SUMP 12R W/ARV (TUBING) IMPLANT
TUBE SALEM SUMP 16 FR W/ARV (TUBING) ×1 IMPLANT
WAND COBLATOR 70 EVAC XTRA (SURGICAL WAND) ×2 IMPLANT

## 2017-01-13 NOTE — Anesthesia Postprocedure Evaluation (Signed)
Anesthesia Post Note  Patient: Danielle Medina  Procedure(s) Performed: TONSILLECTOMY  AND ADENOIDECTOMY (Bilateral Throat)     Patient location during evaluation: PACU Anesthesia Type: General Level of consciousness: sedated and patient cooperative Pain management: pain level controlled Vital Signs Assessment: post-procedure vital signs reviewed and stable Respiratory status: spontaneous breathing Cardiovascular status: stable Anesthetic complications: no    Last Vitals:  Vitals:   01/13/17 1145 01/13/17 1220  BP: 137/88 123/78  Pulse: 85 84  Resp: 13 20  Temp:  36.7 C  SpO2: 100% 100%    Last Pain:  Vitals:   01/13/17 1220  TempSrc:   PainSc: 4                  Lewie LoronJohn Juventino Pavone

## 2017-01-13 NOTE — Op Note (Signed)
DATE OF PROCEDURE:  01/13/2017                              OPERATIVE REPORT  SURGEON:  Newman PiesSu Sable Knoles, MD  PREOPERATIVE DIAGNOSES: 1. Adenotonsillar hypertrophy. 2. Chronic tonsillitis and pharyngitis  POSTOPERATIVE DIAGNOSES: 1. Adenotonsillar hypertrophy. 2. Chronic tonsillitis and pharyngitis  PROCEDURE PERFORMED:  Adenotonsillectomy.  ANESTHESIA:  General endotracheal tube anesthesia.  COMPLICATIONS:  None.  ESTIMATED BLOOD LOSS:  Minimal.  INDICATION FOR PROCEDURE:  Danielle Medina is a 26 y.o. female with a history of chronic tonsillitis/pharyngitis and halitosis.  According to the patient, she has been experiencing chronic throat discomfort with recurrent tonsillitis for several years. The patient continues to be symptomatic despite medical treatments. On examination, the patient was noted to have bilateral cryptic tonsils. Based on the above findings, the decision was made for the patient to undergo the adenotonsillectomy procedure. Likelihood of success in reducing symptoms was also discussed.  The risks, benefits, alternatives, and details of the procedure were discussed with the patient.  Questions were invited and answered.  Informed consent was obtained.  DESCRIPTION:  The patient was taken to the operating room and placed supine on the operating table.  General endotracheal tube anesthesia was administered by the anesthesiologist.  The patient was positioned and prepped and draped in a standard fashion for adenotonsillectomy.  A Crowe-Davis mouth gag was inserted into the oral cavity for exposure. 2+ cryptic tonsils were noted bilaterally.  No bifidity was noted.  Indirect mirror examination of the nasopharynx revealed mild adenoid hypertrophy. The adenoid was ablated with the Coblator device. Hemostasis was achieved with the Coblator device.  The right tonsil was then grasped with a straight Allis clamp and retracted medially.  It was resected free from the underlying pharyngeal  constrictor muscles with the Coblator device.  The same procedure was repeated on the left side without exception.  The surgical sites were copiously irrigated.  The mouth gag was removed.  The care of the patient was turned over to the anesthesiologist.  The patient was awakened from anesthesia without difficulty.  The patient was extubated and transferred to the recovery room in good condition.  OPERATIVE FINDINGS:  Adenotonsillar hypertrophy.  SPECIMEN:  Bilateral tonsils  FOLLOWUP CARE:  The patient will be discharged home once awake and alert.  She will be placed on amoxicillin 800 mg p.o. b.i.d. for 5 days, and oxycodone 5-2510ml po q 4 hours for postop pain control.   The patient will follow up in my office in approximately 2 weeks.  Danielle Medina 01/13/2017 11:09 AM

## 2017-01-13 NOTE — Discharge Instructions (Addendum)

## 2017-01-13 NOTE — Anesthesia Preprocedure Evaluation (Signed)
Anesthesia Evaluation  Patient identified by MRN, date of birth, ID band Patient awake    Reviewed: Allergy & Precautions, H&P , NPO status , Patient's Chart, lab work & pertinent test results, reviewed documented beta blocker date and time   History of Anesthesia Complications Negative for: history of anesthetic complications  Airway Mallampati: I  TM Distance: >3 FB Neck ROM: full    Dental  (+) Teeth Intact   Pulmonary Recent URI  (to complete final dose of zpack for sinus infection today, no fevers),    breath sounds clear to auscultation       Cardiovascular negative cardio ROS   Rhythm:regular Rate:Normal     Neuro/Psych PSYCHIATRIC DISORDERS (ADD) negative neurological ROS     GI/Hepatic Neg liver ROS, GERD  Medicated,  Endo/Other  BMI 35.6  Renal/GU negative Renal ROS  negative genitourinary   Musculoskeletal   Abdominal   Peds  Hematology negative hematology ROS (+)   Anesthesia Other Findings   Reproductive/Obstetrics (+) Pregnancy (breech)                             Anesthesia Physical  Anesthesia Plan  ASA: II  Anesthesia Plan: General   Post-op Pain Management:    Induction: Intravenous  PONV Risk Score and Plan: 4 or greater and Dexamethasone, Ondansetron, Midazolam and Scopolamine patch - Pre-op  Airway Management Planned: Oral ETT  Additional Equipment:   Intra-op Plan:   Post-operative Plan: Extubation in OR  Informed Consent: I have reviewed the patients History and Physical, chart, labs and discussed the procedure including the risks, benefits and alternatives for the proposed anesthesia with the patient or authorized representative who has indicated his/her understanding and acceptance.   Dental advisory given  Plan Discussed with: Surgeon and CRNA  Anesthesia Plan Comments:         Anesthesia Quick Evaluation

## 2017-01-13 NOTE — Transfer of Care (Signed)
Immediate Anesthesia Transfer of Care Note  Patient: Danielle Medina  Procedure(s) Performed: TONSILLECTOMY  AND ADENOIDECTOMY (Bilateral Throat)  Patient Location: PACU  Anesthesia Type:General  Level of Consciousness: awake  Airway & Oxygen Therapy: Patient Spontanous Breathing and Patient connected to face mask oxygen  Post-op Assessment: Report given to RN and Post -op Vital signs reviewed and stable  Post vital signs: Reviewed and stable  Last Vitals:  Vitals:   01/13/17 1119 01/13/17 1120  BP: 135/84   Pulse: (!) 123 (!) 131  Resp:  13  Temp:  (P) 36.8 C  SpO2: 100% 100%    Last Pain:  Vitals:   01/13/17 0903  TempSrc: Oral      Patients Stated Pain Goal: 0 (01/13/17 0903)  Complications: No apparent anesthesia complications

## 2017-01-13 NOTE — H&P (Signed)
Cc: Chronic sore throat  HPI: The patient is a 26 y/o female who presents today for evaluation of chronic tonsillitis. The patient has noted issues with her tonsils since childhood. She has had multiple episodes of strep throat (>3 / year) for the past 5+ years. She also complains of frequent tonsil stones and halitosis. The patient's last episode of tonsillitis was a few weeks ago. She is otherwise healthy. No previous ENT surgery is noted.   The patient's review of systems (constitutional, eyes, ENT, cardiovascular, respiratory, GI, musculoskeletal, skin, neurologic, psychiatric, endocrine, hematologic, allergic) is noted in the ROS questionnaire.  It is reviewed with the patient.   Family health history: None.  Major events: C-section, wisdom teeth extraction.  Ongoing medical problems: None.  Social history: The patient is single. She denies the use of tobacco or illegal drugs. She drinks alcohol twice a month.  Exam General: Communicates without difficulty, well nourished, no acute distress. Head: Normocephalic, no evidence injury, no tenderness, facial buttresses intact without stepoff. Eyes: PERRL, EOMI. No scleral icterus, conjunctivae clear. Neuro: CN II exam reveals vision grossly intact.  No nystagmus at any point of gaze. Ears: Auricles well formed without lesions.  Ear canals are intact without mass or lesion.  No erythema or edema is appreciated.  The TMs are intact without fluid. Right tympanosclerosis is noted. Nose: External evaluation reveals normal support and skin without lesions.  Dorsum is intact.  Anterior rhinoscopy reveals healthy pink mucosa over anterior aspect of inferior turbinates and intact septum.  No purulence noted. Oral:  Oral cavity and oropharynx are intact, symmetric, without erythema or edema.  Mucosa is moist without lesions. Tonsils 3+, cryptic. Neck: Full range of motion without pain.  There is no significant lymphadenopathy.  No masses palpable.  Thyroid bed  within normal limits to palpation.  Parotid glands and submandibular glands equal bilaterally without mass.  Trachea is midline. Neuro:  CN 2-12 grossly intact. Gait normal. Vestibular: No nystagmus at any point of gaze. The cerebellar examination is unremarkable.   Assessment 1.  The patient's history and physical exam findings are consistent with chronic tonsillitis/pharyngitis secondary to adenotonsillar hypertrophy.  Plan  1. The treatment options include continuing conservative observation versus adenotonsillectomy.  Based on the patient's history and physical exam findings, the patient will likely benefit from having the tonsils and adenoid removed.  The risks, benefits, alternatives, and details of the procedure are reviewed with the patient and the parent.  Questions are invited and answered.  2. The patient is interested in proceeding with the procedure.  We will schedule the procedure in accordance with the family schedule.

## 2017-01-13 NOTE — Anesthesia Procedure Notes (Signed)
Procedure Name: Intubation Date/Time: 01/13/2017 10:39 AM Performed by: Caren Macadamarter, Buddy Loeffelholz W, CRNA Pre-anesthesia Checklist: Patient identified, Emergency Drugs available, Suction available and Patient being monitored Patient Re-evaluated:Patient Re-evaluated prior to induction Oxygen Delivery Method: Circle system utilized Preoxygenation: Pre-oxygenation with 100% oxygen Induction Type: IV induction Ventilation: Mask ventilation without difficulty Laryngoscope Size: Miller and 2 Grade View: Grade I Tube type: Oral Tube size: 7.0 mm Number of attempts: 1 Airway Equipment and Method: Stylet and Oral airway Placement Confirmation: ETT inserted through vocal cords under direct vision,  positive ETCO2 and breath sounds checked- equal and bilateral Secured at: 22 cm Tube secured with: Tape Dental Injury: Teeth and Oropharynx as per pre-operative assessment

## 2018-06-06 ENCOUNTER — Encounter: Payer: Self-pay | Admitting: Internal Medicine

## 2018-06-09 ENCOUNTER — Telehealth: Payer: Self-pay | Admitting: Internal Medicine

## 2018-06-09 ENCOUNTER — Ambulatory Visit (INDEPENDENT_AMBULATORY_CARE_PROVIDER_SITE_OTHER): Payer: Medicaid Other | Admitting: Internal Medicine

## 2018-06-09 ENCOUNTER — Encounter: Payer: Self-pay | Admitting: Internal Medicine

## 2018-06-09 DIAGNOSIS — Z20822 Contact with and (suspected) exposure to covid-19: Secondary | ICD-10-CM

## 2018-06-09 DIAGNOSIS — Z20828 Contact with and (suspected) exposure to other viral communicable diseases: Secondary | ICD-10-CM

## 2018-06-09 NOTE — Telephone Encounter (Signed)
Agree 

## 2018-06-09 NOTE — Telephone Encounter (Signed)
Scheduled virtual visit, also ask her to call and see if results could be faxed to our office.

## 2018-06-09 NOTE — Patient Instructions (Addendum)
Patient to have results from Covid 19  Testing done out of town test results faxed here.  She should contact [facility] Department for further advisement.  She should quarantine along with her son and fianc for 14 days at home.  Addendum: Health department advises quarantine x 14 days as well.

## 2018-06-09 NOTE — Telephone Encounter (Signed)
Danielle Medina [phone]  Danielle Medina called to say she had been exposed to COVID-19 thru her sister-in-law, so she went to the drive thru clinic at [facility] in [place] on Saturday and they called her a little bit ago and said she was postive and she needed to call her PCP to see what the next steps was. Her son and boyfriend that she lives with were tested but they were negative. She is Asymptomatic, should she stay away from them. I suggested a virtual call to discuss.

## 2018-06-09 NOTE — Telephone Encounter (Signed)
Agree with virtual visit 

## 2018-06-09 NOTE — Progress Notes (Signed)
   Subjective:    Patient ID: Danielle Medina, female    DOB: 01/12/1991, 28 y.o.   MRN: [id number]  HPI 28 year old Female not seen here since 2016 seen today via interactive audio and video telecommunications due to the coronavirus pandemic.  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  She agrees to visit in this format today.  Patient was informed today by telephone that she tested positive for COVID-19 at a drive-through test center sponsored by [employer].  This was in [place].  She has asked that we receive a copy of report.  She does not receive a copy of her report but only a phone call.  Patient says that her fianc's sister who resides next door tested positive for COVID-19 after exposure to a Covid 19 positive individual through at home close contact.  Patient was work letter done today unless she (virtual assessment  Patient's fianc and her 65-year-old son were also tested at [employer] test site and tested negative.  Patient is completely asymptomatic.  Her fianc and son are asymptomatic.  However patient indicates that he had had close contact with COVID 19 positive individuals next-door.  Patient is asking for advice.  My advice would be that she, her son, and her fianc should quarantine at home for 14 days.  I have given her the telephone number for [facility] department to contact them for further information.  Patient is asking about being retested.  I would certainly wait until after her quarantine period to retest her.  Also, she might benefit from antibody testing.      Review of Systems see above patient denies fever chills headache myalgias     Objective:   Physical Exam  Seen today on virtual visit platform and appears to be in no acute distress      Assessment & Plan:  Positive test for COVID-19  Plan: Await receipt of test results which are supposed to be faxed to me.  I had not received  them by 5:45 PM today.  Recommend quarantining for 14 days along with her son and fianc at home.  She should contact [facility] Department for further instructions.  If she remains asymptomatic after the 14-day quarantine, I am not sure that she really needs to be retested via nasal swab.  She could consider an antibody test for IgG and IgM antibodies to COVID-19.  20 minutes spent with patient including time with medical decision making.

## 2018-06-09 NOTE — Telephone Encounter (Signed)
Danielle Medina called back to say she had spoken with Health Department and they said for her to self quarantine for 14 days from the day of her results along with any family members that live with her.

## 2018-06-11 ENCOUNTER — Telehealth: Payer: Self-pay | Admitting: Internal Medicine

## 2018-06-11 NOTE — Telephone Encounter (Signed)
She is under quarantine for 14 days. I do not want to prescribe any meds at this time to confuse the issue. We do not treat finger nail fungus. She would need to see dermatologist to confirm that she has nail fungus after the 14 day period is up.

## 2018-06-11 NOTE — Telephone Encounter (Signed)
Danielle Medina [phone]  Danielle Medina called to say she forgot to mention the other day that she has a fungus under several of her nails and would like to get something called in. I suggested virtual visit to discuss.

## 2018-06-11 NOTE — Telephone Encounter (Signed)
Called Jori to let her know what Dr Lenord Fellers said, she verbally acknowledged understanding.

## 2021-07-20 DIAGNOSIS — H5213 Myopia, bilateral: Secondary | ICD-10-CM | POA: Diagnosis not present

## 2023-07-01 DIAGNOSIS — H52223 Regular astigmatism, bilateral: Secondary | ICD-10-CM | POA: Diagnosis not present

## 2023-07-01 DIAGNOSIS — H5213 Myopia, bilateral: Secondary | ICD-10-CM | POA: Diagnosis not present

## 2023-07-02 DIAGNOSIS — Z1239 Encounter for other screening for malignant neoplasm of breast: Secondary | ICD-10-CM | POA: Diagnosis not present

## 2023-07-02 DIAGNOSIS — Z1151 Encounter for screening for human papillomavirus (HPV): Secondary | ICD-10-CM | POA: Diagnosis not present

## 2023-07-02 DIAGNOSIS — Z01419 Encounter for gynecological examination (general) (routine) without abnormal findings: Secondary | ICD-10-CM | POA: Diagnosis not present
